# Patient Record
Sex: Female | Born: 1985 | Race: Black or African American | Hispanic: No | Marital: Married | Smoking: Never smoker
Health system: Southern US, Community
[De-identification: ages and names within clinical notes are randomized; demographics above are authoritative.]

## PROBLEM LIST (undated history)

## (undated) ENCOUNTER — Inpatient Hospital Stay (HOSPITAL_COMMUNITY): Payer: Self-pay

## (undated) DIAGNOSIS — R51 Headache: Secondary | ICD-10-CM

## (undated) DIAGNOSIS — Z789 Other specified health status: Secondary | ICD-10-CM

---

## 2007-06-01 ENCOUNTER — Encounter (INDEPENDENT_AMBULATORY_CARE_PROVIDER_SITE_OTHER): Payer: Self-pay | Admitting: Nurse Practitioner

## 2007-06-01 ENCOUNTER — Ambulatory Visit: Payer: Self-pay | Admitting: Internal Medicine

## 2007-06-01 DIAGNOSIS — M25539 Pain in unspecified wrist: Secondary | ICD-10-CM

## 2007-06-14 ENCOUNTER — Encounter (INDEPENDENT_AMBULATORY_CARE_PROVIDER_SITE_OTHER): Payer: Self-pay | Admitting: Internal Medicine

## 2007-06-30 ENCOUNTER — Encounter (INDEPENDENT_AMBULATORY_CARE_PROVIDER_SITE_OTHER): Payer: Self-pay | Admitting: Internal Medicine

## 2010-02-03 NOTE — Letter (Signed)
Summary: RECEVIED RECORDS FROM [facility]   RECEVIED RECORDS FROM [facility]   Imported By: Arta Bruce 08/21/2009 14:40:49  _____________________________________________________________________  External Attachment:    Type:   Image     Comment:   External Document

## 2010-04-11 ENCOUNTER — Emergency Department (HOSPITAL_COMMUNITY)
Admission: EM | Admit: 2010-04-11 | Discharge: 2010-04-11 | Disposition: A | Discharge status: Home / Self Care | Payer: Self-pay | Attending: Emergency Medicine | Admitting: Emergency Medicine

## 2010-04-11 DIAGNOSIS — R071 Chest pain on breathing: Secondary | ICD-10-CM | POA: Insufficient documentation

## 2010-04-11 DIAGNOSIS — M25539 Pain in unspecified wrist: Secondary | ICD-10-CM | POA: Insufficient documentation

## 2010-04-11 DIAGNOSIS — G5600 Carpal tunnel syndrome, unspecified upper limb: Secondary | ICD-10-CM | POA: Insufficient documentation

## 2010-04-11 DIAGNOSIS — R1013 Epigastric pain: Secondary | ICD-10-CM | POA: Insufficient documentation

## 2010-04-11 LAB — CBC
MCH: 30.2 pg (ref 26.0–34.0)
MCHC: 34.9 g/dL (ref 30.0–36.0)
Platelets: 252 10*3/uL (ref 150–400)

## 2010-04-11 LAB — GLUCOSE, CAPILLARY: Glucose-Capillary: 83 mg/dL (ref 70–99)

## 2010-04-11 LAB — URINALYSIS, ROUTINE W REFLEX MICROSCOPIC
Bilirubin Urine: NEGATIVE
Glucose, UA: NEGATIVE mg/dL
Hgb urine dipstick: NEGATIVE
Protein, ur: NEGATIVE mg/dL
Specific Gravity, Urine: 1.007 (ref 1.005–1.030)
Urobilinogen, UA: 0.2 mg/dL (ref 0.0–1.0)

## 2010-04-11 LAB — POCT I-STAT, CHEM 8
BUN: 8 mg/dL (ref 6–23)
HCT: 37 % (ref 36.0–46.0)
Hemoglobin: 12.6 g/dL (ref 12.0–15.0)
Sodium: 141 mEq/L (ref 135–145)
TCO2: 24 mmol/L (ref 0–100)

## 2010-04-11 LAB — DIFFERENTIAL
Basophils Relative: 0 % (ref 0–1)
Eosinophils Absolute: 0.1 10*3/uL (ref 0.0–0.7)
Eosinophils Relative: 2 % (ref 0–5)
Monocytes Absolute: 0.6 10*3/uL (ref 0.1–1.0)
Monocytes Relative: 8 % (ref 3–12)

## 2010-04-11 LAB — POCT CARDIAC MARKERS: Myoglobin, poc: 35.6 ng/mL (ref 12–200)

## 2010-05-04 ENCOUNTER — Emergency Department (HOSPITAL_COMMUNITY)
Admission: EM | Admit: 2010-05-04 | Discharge: 2010-05-04 | Disposition: A | Discharge status: Home / Self Care | Payer: Self-pay | Attending: Emergency Medicine | Admitting: Emergency Medicine

## 2010-05-04 ENCOUNTER — Emergency Department (HOSPITAL_COMMUNITY): Payer: Self-pay

## 2010-05-04 DIAGNOSIS — K219 Gastro-esophageal reflux disease without esophagitis: Secondary | ICD-10-CM | POA: Insufficient documentation

## 2010-05-04 DIAGNOSIS — R079 Chest pain, unspecified: Secondary | ICD-10-CM | POA: Insufficient documentation

## 2010-05-04 DIAGNOSIS — R1013 Epigastric pain: Secondary | ICD-10-CM | POA: Insufficient documentation

## 2010-10-18 ENCOUNTER — Observation Stay (HOSPITAL_COMMUNITY)
Admission: AD | Admit: 2010-10-18 | Discharge: 2010-10-19 | Disposition: A | Discharge status: Home / Self Care | Payer: Medicaid Other | Source: Ambulatory Visit | Attending: Obstetrics & Gynecology | Admitting: Obstetrics & Gynecology

## 2010-10-18 ENCOUNTER — Emergency Department (HOSPITAL_COMMUNITY): Payer: Medicaid Other

## 2010-10-18 ENCOUNTER — Emergency Department (HOSPITAL_COMMUNITY)
Admission: EM | Admit: 2010-10-18 | Discharge: 2010-10-18 | Disposition: A | Discharge status: Nursing Home (Includes ICF) | Payer: Medicaid Other | Attending: Emergency Medicine | Admitting: Emergency Medicine

## 2010-10-18 ENCOUNTER — Encounter (HOSPITAL_COMMUNITY): Payer: Self-pay

## 2010-10-18 DIAGNOSIS — R42 Dizziness and giddiness: Secondary | ICD-10-CM | POA: Insufficient documentation

## 2010-10-18 DIAGNOSIS — R110 Nausea: Secondary | ICD-10-CM | POA: Insufficient documentation

## 2010-10-18 DIAGNOSIS — Z30432 Encounter for removal of intrauterine contraceptive device: Secondary | ICD-10-CM | POA: Insufficient documentation

## 2010-10-18 DIAGNOSIS — O200 Threatened abortion: Principal | ICD-10-CM | POA: Diagnosis present

## 2010-10-18 DIAGNOSIS — O209 Hemorrhage in early pregnancy, unspecified: Secondary | ICD-10-CM | POA: Insufficient documentation

## 2010-10-18 DIAGNOSIS — Z30431 Encounter for routine checking of intrauterine contraceptive device: Secondary | ICD-10-CM

## 2010-10-18 DIAGNOSIS — IMO0002 Reserved for concepts with insufficient information to code with codable children: Secondary | ICD-10-CM | POA: Insufficient documentation

## 2010-10-18 DIAGNOSIS — R109 Unspecified abdominal pain: Secondary | ICD-10-CM | POA: Insufficient documentation

## 2010-10-18 DIAGNOSIS — R630 Anorexia: Secondary | ICD-10-CM | POA: Insufficient documentation

## 2010-10-18 LAB — POCT I-STAT, CHEM 8
BUN: 6 mg/dL (ref 6–23)
Calcium, Ion: 1.19 mmol/L (ref 1.12–1.32)
Chloride: 104 mEq/L (ref 96–112)
Creatinine, Ser: 0.6 mg/dL (ref 0.50–1.10)
Glucose, Bld: 76 mg/dL (ref 70–99)
HCT: 32 % — ABNORMAL LOW (ref 36.0–46.0)
Hemoglobin: 10.9 g/dL — ABNORMAL LOW (ref 12.0–15.0)
Potassium: 3.7 mEq/L (ref 3.5–5.1)
Sodium: 137 mEq/L (ref 135–145)
TCO2: 22 mmol/L (ref 0–100)

## 2010-10-18 LAB — ABO/RH
ABO/RH(D): O POS
ABO/RH(D): O POS

## 2010-10-18 LAB — URINALYSIS, ROUTINE W REFLEX MICROSCOPIC
Bilirubin Urine: NEGATIVE
Nitrite: NEGATIVE
Protein, ur: NEGATIVE mg/dL
Specific Gravity, Urine: 1.022 (ref 1.005–1.030)
Urobilinogen, UA: 1 mg/dL (ref 0.0–1.0)

## 2010-10-18 LAB — CBC
MCHC: 35.5 g/dL (ref 30.0–36.0)
MCV: 85.9 fL (ref 78.0–100.0)
Platelets: 226 10*3/uL (ref 150–400)
RDW: 13 % (ref 11.5–15.5)
WBC: 9.2 10*3/uL (ref 4.0–10.5)

## 2010-10-18 LAB — TYPE AND SCREEN: Antibody Screen: NEGATIVE

## 2010-10-18 LAB — WET PREP, GENITAL
Clue Cells Wet Prep HPF POC: NONE SEEN
Trich, Wet Prep: NONE SEEN
Yeast Wet Prep HPF POC: NONE SEEN

## 2010-10-18 LAB — URINE MICROSCOPIC-ADD ON

## 2010-10-18 LAB — POCT PREGNANCY, URINE: Preg Test, Ur: POSITIVE

## 2010-10-18 LAB — HCG, QUANTITATIVE, PREGNANCY: hCG, Beta Chain, Quant, S: 110442 m[IU]/mL — ABNORMAL HIGH (ref <5)

## 2010-10-18 MED ORDER — CLINDAMYCIN PHOSPHATE 900 MG/50ML IV SOLN
900.0000 mg | Freq: Three times a day (TID) | INTRAVENOUS | Status: DC
  Administered 2010-10-18: 900 mg via INTRAVENOUS
  Filled 2010-10-18 (×3): qty 50

## 2010-10-18 MED ORDER — ZOLPIDEM TARTRATE 5 MG PO TABS: 5.0000 mg | ORAL_TABLET | Freq: Every evening | ORAL | Status: DC | PRN

## 2010-10-18 MED ORDER — SODIUM CHLORIDE 0.9 % IV SOLN: 250.0000 mL | INTRAVENOUS | Status: DC

## 2010-10-18 MED ORDER — DOCUSATE SODIUM 100 MG PO CAPS
100.0000 mg | ORAL_CAPSULE | Freq: Every day | ORAL | Status: DC
  Administered 2010-10-19: 100 mg via ORAL
  Filled 2010-10-18: qty 1

## 2010-10-18 MED ORDER — SIMETHICONE 80 MG PO CHEW: 80.0000 mg | CHEWABLE_TABLET | Freq: Four times a day (QID) | ORAL | Status: DC | PRN

## 2010-10-18 MED ORDER — PRENATAL PLUS 27-1 MG PO TABS
1.0000 | ORAL_TABLET | Freq: Every day | ORAL | Status: DC
  Administered 2010-10-19: 1 via ORAL
  Filled 2010-10-18: qty 1

## 2010-10-18 MED ORDER — ONDANSETRON HCL 4 MG/2ML IJ SOLN: 4.0000 mg | Freq: Four times a day (QID) | INTRAMUSCULAR | Status: DC | PRN

## 2010-10-18 MED ORDER — SODIUM CHLORIDE 0.9 % IJ SOLN: 3.0000 mL | Freq: Two times a day (BID) | INTRAMUSCULAR | Status: DC

## 2010-10-18 MED ORDER — ONDANSETRON HCL 4 MG PO TABS: 4.0000 mg | ORAL_TABLET | Freq: Four times a day (QID) | ORAL | Status: DC | PRN

## 2010-10-18 MED ORDER — MAGNESIUM HYDROXIDE 400 MG/5ML PO SUSP: 30.0000 mL | Freq: Every day | ORAL | Status: DC | PRN

## 2010-10-18 MED ORDER — SODIUM CHLORIDE 0.9 % IJ SOLN: 3.0000 mL | INTRAMUSCULAR | Status: DC | PRN

## 2010-10-18 MED ORDER — OXYCODONE-ACETAMINOPHEN 5-325 MG PO TABS
1.0000 | ORAL_TABLET | ORAL | Status: DC | PRN
  Administered 2010-10-18: 1 via ORAL
  Filled 2010-10-18: qty 1

## 2010-10-18 MED ORDER — CLINDAMYCIN PHOSPHATE 900 MG/50ML IV SOLN
900.0000 mg | Freq: Three times a day (TID) | INTRAVENOUS | Status: DC
  Administered 2010-10-19: 900 mg via INTRAVENOUS
  Filled 2010-10-18 (×3): qty 50

## 2010-10-18 NOTE — H&P (Signed)
Ann Clarke is an 25 y.o. female c/o vaginal bleeding  HPI:  Several month h/o abnormal bleeding.  She presented to the [facility] ED and was diagnosed w/a 14 week, viable IUP.  An U/S showed a subchorionic hemorrhage and IUD in the cervix.  Pertinent Gynecological History: Contraception: IUD   Menstrual History:  Patient's last menstrual period was 06/18/2010.    History reviewed. No pertinent past medical history.  Past Surgical History  Procedure Date  . Cesarean section     History reviewed. No pertinent family history.  Social History:  reports that she has never smoked. She has never used smokeless tobacco. She reports that she does not drink alcohol or use illicit drugs.  Allergies: No Known Allergies  No prescriptions prior to admission    Review of Systems  Constitutional: Negative for fever.  Eyes: Negative for blurred vision.  Respiratory: Negative for shortness of breath.   Gastrointestinal: Negative for nausea and vomiting.  Genitourinary:       Vaginal bleeding  Skin: Negative for rash.  Neurological: Negative for headaches.    Blood pressure 90/60, pulse 71, temperature 98.9 F (37.2 C), temperature source Oral, resp. rate 16, height 4\' 11"  (1.499 m), weight 68.04 kg (150 lb), last menstrual period 06/18/2010, SpO2 100.00%. Physical Exam  Constitutional: She appears well-developed.  HENT:  Head: Normocephalic.  Neck: Neck supple. No thyromegaly present.  Cardiovascular: Normal rate and regular rhythm.   Respiratory: Breath sounds normal.  GI: Soft. Bowel sounds are normal.  Genitourinary:       Moderate heme per NP  Skin: No rash noted.    Results for orders placed during the hospital encounter of 10/18/10 (from the past 24 hour(s))  TYPE AND SCREEN     Status: Normal   Collection Time   10/18/10  5:00 PM      Component Value Range   ABO/RH(D) O POS     Antibody Screen NEG     Sample Expiration 10/21/2010    ABO/RH     Status: Normal   Collection Time   10/18/10  5:00 PM      Component Value Range   ABO/RH(D) O POS      US Ob Limited  10/18/2010  **ADDENDUM** CREATED: 10/18/2010 11:17:41  Although not reported as part of the initial history, this patient apparently has an IUD in situ.  Reevaluation of the ultrasound images reveals a linear echogenic structure within the proximal cervix.  This would be compatible with an IUD.  **END ADDENDUM** SIGNED BY: Minerva Areola A. Molli Posey, M.D.   10/18/2010  *RADIOLOGY REPORT*  Clinical Data: Positive pregnancy test with the vaginal bleeding.  LIMITED OBSTETRIC ULTRASOUND single living intrauterine gestation is identified.  The BPD is 2.5 cm which estimates a 14-week-1-day gestational age.  Fetal heart activity is visible within the measured rate of 157 beats per minute.  Fetal movement is observed. Amniotic fluid volume is subjectively normal.  Posterior placenta noted with inferior margin near the internal cervical os.  The patient has a small to moderate posterior subchorionic hemorrhage with a second apparent tiny focus of subchorionic hemorrhage anteriorly. Maternal ovaries are unremarkable.  IMPRESSION: Single living intrauterine gestation at an estimated 14-week-1-day distention only age by ultrasound.  Two foci of subchorionic hemorrhage are evident, the larger is small to moderate in character posteriorly.  Dedicated fetal anatomic assessment at 18 weeks is recommended for further evaluation. Original Report Authenticated By: ERIC A. MANSELL, M.D.    Assessment/Plan:24 y.o. w/an IUP @  14 weeks.  Threatened AB.  IUD insitu.  RH pos  Observation Strict pad count Attempt IUD removal  JACKSON-MOORE,Abas Leicht A 10/18/2010, 9:17 PM

## 2010-10-19 DIAGNOSIS — Z30431 Encounter for routine checking of intrauterine contraceptive device: Secondary | ICD-10-CM

## 2010-10-19 LAB — CBC
HCT: 29.2 % — ABNORMAL LOW (ref 36.0–46.0)
Hemoglobin: 10.2 g/dL — ABNORMAL LOW (ref 12.0–15.0)
RBC: 3.33 MIL/uL — ABNORMAL LOW (ref 3.87–5.11)

## 2010-10-19 LAB — GC/CHLAMYDIA PROBE AMP, GENITAL
Chlamydia, DNA Probe: NEGATIVE
GC Probe Amp, Genital: NEGATIVE

## 2010-10-19 MED ORDER — CLINDAMYCIN HCL 300 MG PO CAPS: 300.0000 mg | ORAL_CAPSULE | Freq: Three times a day (TID) | ORAL | Status: DC

## 2010-10-19 NOTE — Progress Notes (Signed)
  S: Less bleeding  O: Blood pressure 94/56, pulse 73, temperature 98.8 F (37.1 C), temperature source Oral, resp. rate 16, height 4\' 11"  (1.499 m), weight 68.04 kg (150 lb), last menstrual period 06/18/2010, SpO2 99.00%.   FHT:FHR by doppler OK Toco: N/A SVE:   A/P- 25 y.o. admitted with threatened abortion/IUD insitu  Bleeding decreased  Attempt IUD removal D/C planning

## 2010-10-19 NOTE — Discharge Summary (Signed)
  Physician Discharge Summary  Patient ID: Ann Clarke MRN: [id number] DOB/AGE: 1985-11-08 24 y.o.  Admit date: 10/18/2010 Discharge date: 10/19/2010  Admission Diagnoses:    Threatened abortion    Discharge Diagnoses:  Active Problems:  Threatened abortion  Surveillance of previously prescribed intrauterine contraceptive device   Discharged Condition: stable  Hospital Course:   The patient presented to the [facility] ED with vaginal bleeding.  She was found to have a viable 14 week IUP with an IUD insitu.  An U/S also showed a subchorionic hemorrhage.  The patient's blood type is RH positive.  The bleeding abated.  The IUD was removed.  Consults: none  Significant Diagnostic Studies: radiology: Ultrasound: See above  Treatments: antibiotics: Clindamycin  Discharge Exam: Blood pressure 94/56, pulse 73, temperature 98.8 F (37.1 C), temperature source Oral, resp. rate 16, height 4\' 11"  (1.499 m), weight 68.04 kg (150 lb), last menstrual period 06/18/2010, SpO2 99.00%. General appearance: alert Pelvic: cervix closed, small heme  Disposition: Home      Signed: JACKSON-MOORE,Raseel Jans A 10/19/2010, 9:35 AM

## 2010-10-19 NOTE — Progress Notes (Signed)
Pt discharged to home with husband.  Condition stable.  Pt to car via wheelchair with NT.   No equipment for home ordered at discharge.

## 2010-10-19 NOTE — Progress Notes (Signed)
The patient was placed in lithotomy.  A speculum was placed.  The cervix appeared closed.  The IUD strings were seen.  There was small heme in the vaginal vault.  The IUD strings were grasped.  The IUD was removed intact and sent for C&S.

## 2010-10-19 NOTE — Progress Notes (Signed)
UR chart review completed.  

## 2010-10-24 ENCOUNTER — Encounter (HOSPITAL_COMMUNITY): Payer: Self-pay

## 2010-10-24 ENCOUNTER — Inpatient Hospital Stay (HOSPITAL_COMMUNITY)
Admission: AD | Admit: 2010-10-24 | Discharge: 2010-10-24 | Disposition: A | Discharge status: Home / Self Care | Payer: Self-pay | Source: Ambulatory Visit | Attending: Obstetrics & Gynecology | Admitting: Obstetrics & Gynecology

## 2010-10-24 DIAGNOSIS — O209 Hemorrhage in early pregnancy, unspecified: Secondary | ICD-10-CM

## 2010-10-24 NOTE — Progress Notes (Signed)
Patient c/o lower abdominal pain since last night, was seen in MAU last Monday for bleeding, still bleeding like a period.

## 2010-10-24 NOTE — ED Provider Notes (Signed)
Ceriah Clarke y.o.G3P2002 @15 .0 wk BEGA by [place] at [redacted]w[redacted]d Chief Complaint  Patient presents with  . Abdominal Pain  . Vaginal Bleeding    SUBJECTIVE  HPI: for the last day she is been bleeding a little more heavily and cramping intermittently. She initially presented to hospital 10/18/2010 4 vaginal bleeding with IUD in place. She was transferred here and kept overnight for observation after the IUD was removed by Dr. Tamela Oddi. Her ultrasound showed a viable IUP at 14 weeks.  No past medical history on file. Ob Hx: Gyn Hx: Past Surgical History  Procedure Date  . Cesarean section    History   Social History  . Marital Status: Married    Spouse Name: N/A    Number of Children: N/A  . Years of Education: N/A   Occupational History  . Not on file.   Social History Main Topics  . Smoking status: Never Smoker   . Smokeless tobacco: Never Used  . Alcohol Use: No  . Drug Use: No  . Sexually Active: Yes    Birth Control/ Protection: IUD   Other Topics Concern  . Not on file   Social History Narrative  . No narrative on file   No current facility-administered medications on file prior to encounter.   No current outpatient prescriptions on file prior to encounter.   No Known Allergies  ROS: Pertinent items in HPI  OBJECTIVE  BP 118/59  Pulse 77  Temp(Src) 99.4 F (37.4 C) (Oral)  Resp 16  Ht 4' 11.5" (1.511 m)  Wt 67.949 kg (149 lb 12.8 oz)  BMI 29.75 kg/m2  LMP 06/18/2010   Physical Exam:  DT FHR 160. BS [place] by me to reassure parents: saw cardiac activity and FM  General: WN/WD in NAD Abd: soft NT uterus 14-16 wk Pelvic: scant dk red heme. Cx L/C/H Back:neg CVAT  ASSESSMENT  Multip at 15 wks viable IUP with vaginal bleeding post IUD removal.    PLAN Reassured S/SX SAB reviewed Keep appt in 4 d at [facility]

## 2010-10-25 ENCOUNTER — Inpatient Hospital Stay (HOSPITAL_COMMUNITY)
Admission: AD | Admit: 2010-10-25 | Discharge: 2010-10-25 | Disposition: A | Discharge status: Home / Self Care | Payer: Medicaid Other | Source: Ambulatory Visit | Attending: Obstetrics and Gynecology | Admitting: Obstetrics and Gynecology

## 2010-10-25 ENCOUNTER — Other Ambulatory Visit: Payer: Self-pay | Admitting: Obstetrics and Gynecology

## 2010-10-25 ENCOUNTER — Encounter (HOSPITAL_COMMUNITY): Payer: Self-pay | Admitting: Advanced Practice Midwife

## 2010-10-25 DIAGNOSIS — O039 Complete or unspecified spontaneous abortion without complication: Secondary | ICD-10-CM | POA: Insufficient documentation

## 2010-10-25 DIAGNOSIS — O209 Hemorrhage in early pregnancy, unspecified: Secondary | ICD-10-CM | POA: Insufficient documentation

## 2010-10-25 HISTORY — DX: Other specified health status: Z78.9

## 2010-10-25 LAB — CBC
Hemoglobin: 10.3 g/dL — ABNORMAL LOW (ref 12.0–15.0)
Platelets: 241 10*3/uL (ref 150–400)
RBC: 3.37 MIL/uL — ABNORMAL LOW (ref 3.87–5.11)
WBC: 14.6 10*3/uL — ABNORMAL HIGH (ref 4.0–10.5)

## 2010-10-25 MED ORDER — OXYCODONE-ACETAMINOPHEN 5-325 MG PO TABS
1.0000 | ORAL_TABLET | Freq: Once | ORAL | Status: AC
  Administered 2010-10-25: 1 via ORAL
  Filled 2010-10-25: qty 1

## 2010-10-25 MED ORDER — IBUPROFEN 800 MG PO TABS
800.0000 mg | ORAL_TABLET | Freq: Once | ORAL | Status: AC
  Administered 2010-10-25: 800 mg via ORAL
  Filled 2010-10-25: qty 1

## 2010-10-25 MED ORDER — OXYCODONE-ACETAMINOPHEN 5-325 MG PO TABS: 1.0000 | ORAL_TABLET | ORAL | Status: AC | PRN

## 2010-10-25 NOTE — Progress Notes (Signed)
Pt here yesterday for vaginal bleeding and abd pain. Reports bleeding is heavier today and pain is worse. Unable to get rx filled. Stated pharmacy said tylenol has been recalled and there was none.

## 2010-10-25 NOTE — Progress Notes (Signed)
Pt's husband at bedside.  Expressing some concern as to pt delivering at home after being seen here earlier.  Reassured husband and pt and will get provider to see pt and husband per husband's request.

## 2010-10-25 NOTE — Progress Notes (Signed)
Pt presents to mau via ems for miscarriage at home.  Brought fetus with her.  Placenta had not delivered at that time.

## 2010-10-25 NOTE — Progress Notes (Signed)
Discussed with pt about disposal of baby.  Given option to take home or hospital disposal.  Pt wishes for hospital disposal.

## 2010-10-25 NOTE — Progress Notes (Signed)
N. Bascom Levels, CNM at bedside.  poc discussed with pt and family.

## 2010-10-25 NOTE — Progress Notes (Signed)
Placenta removed manually via Telford Nab, CNM

## 2010-10-25 NOTE — ED Provider Notes (Signed)
History     Chief Complaint  Patient presents with  . Miscarriage   HPI Onset of pain and bleeding yesterday, seen in MAU, cervix LTC yesterday, delivered 15 week fetus at home, placenta in vagina upon arrival to MAU.  OB History    Grav Para Term Preterm Abortions TAB SAB Ect Mult Living   3 2 2       2       No past medical history on file.  Past Surgical History  Procedure Date  . Cesarean section     No family history on file.  History  Substance Use Topics  . Smoking status: Never Smoker   . Smokeless tobacco: Never Used  . Alcohol Use: No    Allergies: No Known Allergies  Prescriptions prior to admission  Medication Sig Dispense Refill  . clindamycin (CLEOCIN) 300 MG capsule Take 300 mg by mouth 3 (three) times daily.        Marland Kitchen oxyCODONE-acetaminophen (PERCOCET) 5-325 MG per tablet Take 1 tablet by mouth every 4 (four) hours as needed for pain.  6 tablet  0    Review of Systems  Respiratory: Negative.   Cardiovascular: Negative.   Gastrointestinal: Positive for abdominal pain.  Genitourinary:       + vaginal bleeding   Neurological: Negative.    Physical Exam   Blood pressure 133/84, pulse 82, temperature 98.6 F (37 C), resp. rate 20, height 4\' 11"  (1.499 m), weight 68.04 kg (150 lb), last menstrual period 06/18/2010.  Physical Exam  Constitutional: She appears well-developed and well-nourished. No distress.  Cardiovascular: Normal rate.   Respiratory: Effort normal.  GI: Soft. There is tenderness (low abd).  Genitourinary: There is bleeding around the vagina.       Partial placenta at introitus upon arrival to mau, upon speculum exam, remainder of placenta noted in cervical os, removed with ring forceps, large gush of blood and clots immediately evacuated, bleeding then slowed, fundus firm    MAU Course  Procedures  No results found for this or any previous visit (from the past 24 hour(s)).  Percocet and Motrin ordered for pain.   Assessment  and Plan  CBC pending Care assumed by S. Breeanna Galgano, CNM  [facility] 10/25/2010, 7:24 PM   Complete AB at 15 weeks D/C home Continue pain meds and ABX as prescribed FU in Gyn Clinic in 4-6 weeks

## 2010-10-25 NOTE — ED Provider Notes (Signed)
Ann Clarke y.o.G3P2002 @[redacted]w[redacted]d  BEGA by 14 wk [place] Chief Complaint  Patient presents with  . Vaginal Bleeding  . Abdominal Pain    SUBJECTIVE  HPI: Presents via EMS stating her pain is worse than yesterday and her bleeding is "not like it should be." She passed a black clot about the size of a fingernail. Her partner states they were not able to find Tylenol at the drug store. At one point she told him she couldn't walk due to the pain. I saw her here yesterday for vaginal bleeding and abdominal pain and confirmed IUP and by Doptone fetal heart rate as well as bedside ultrasound so the couple could see fetal movement and fetal cardiac activity. She initially presented to [facility] ED on 10/18/2010 with history of abnormal leading for several months. She was found to have a viable IUP, IUD in situ and subchorionic hemorrhage. IUD was removed by Dr. Tamela Oddi and patient was observed overnight and sent home on clindamycin. The bleeding has been continuous since then and on exam yesterday she had small amount of vaginal bleeding.  No past medical history on file.  Past Surgical History  Procedure Date  . Cesarean section    History   Social History  . Marital Status: Married    Spouse Name: N/A    Number of Children: N/A  . Years of Education: N/A   Occupational History  . Not on file.   Social History Main Topics  . Smoking status: Never Smoker   . Smokeless tobacco: Never Used  . Alcohol Use: No  . Drug Use: No  . Sexually Active: Yes    Birth Control/ Protection: IUD   Other Topics Concern  . Not on file   Social History Narrative  . No narrative on file   No current facility-administered medications on file prior to encounter.   Current Outpatient Prescriptions on File Prior to Encounter  Medication Sig Dispense Refill  . clindamycin (CLEOCIN) 300 MG capsule Take 300 mg by mouth 3 (three) times daily.         No Known Allergies  ROS: Pertinent items in  HPI  OBJECTIVE  BP 120/81  Pulse 98  Temp(Src) 99.1 F (37.3 C) (Oral)  Resp 18  LMP 06/18/2010   Physical Exam:  General: WN/WD in NAD Abd: soft NT. Fundus at u (short torso) DT FHR 180's with audible FM.  Pelvic: deferred. Peri pad had 3 cm streak of dark dry blood    ASSESSMENT G3 P2 002 at 15 weeks 1 day with viable IUP Status post IUD removal at 14 weeks Bleeding in early pregnancy    PLAN  Reassured of viability. Advised to keep her appointment at [facility] later this week. Again reviewed signs and symptoms of SAB. Will give her 6 Percocets to take 1 every 4-6 hours when necessary for breakthrough pain if Tylenol ineffective.

## 2010-10-25 NOTE — Progress Notes (Signed)
Ann Clarke, CNM at bedside.  poc discussed with pt.

## 2010-10-26 NOTE — ED Provider Notes (Signed)
Agree with above note.  Aladdin Kollmann 10/26/2010 7:33 AM

## 2010-10-26 NOTE — ED Provider Notes (Signed)
Agree with above note.  Ann Clarke 10/26/2010 7:41 AM

## 2010-11-05 DEATH — deceased

## 2010-12-14 ENCOUNTER — Encounter: Payer: Self-pay | Admitting: Physician Assistant

## 2011-01-04 ENCOUNTER — Encounter (HOSPITAL_COMMUNITY): Payer: Self-pay

## 2011-01-04 ENCOUNTER — Emergency Department (HOSPITAL_COMMUNITY): Payer: Self-pay

## 2011-01-04 ENCOUNTER — Emergency Department (HOSPITAL_COMMUNITY)
Admission: EM | Admit: 2011-01-04 | Discharge: 2011-01-04 | Disposition: A | Discharge status: Home / Self Care | Payer: Self-pay | Attending: Emergency Medicine | Admitting: Emergency Medicine

## 2011-01-04 DIAGNOSIS — R109 Unspecified abdominal pain: Secondary | ICD-10-CM | POA: Insufficient documentation

## 2011-01-04 DIAGNOSIS — R002 Palpitations: Secondary | ICD-10-CM | POA: Insufficient documentation

## 2011-01-04 DIAGNOSIS — F411 Generalized anxiety disorder: Secondary | ICD-10-CM | POA: Insufficient documentation

## 2011-01-04 DIAGNOSIS — R51 Headache: Secondary | ICD-10-CM | POA: Insufficient documentation

## 2011-01-04 LAB — BASIC METABOLIC PANEL
BUN: 11 mg/dL (ref 6–23)
CO2: 25 mEq/L (ref 19–32)
Chloride: 103 mEq/L (ref 96–112)
Glucose, Bld: 81 mg/dL (ref 70–99)
Potassium: 3.5 mEq/L (ref 3.5–5.1)
Sodium: 137 mEq/L (ref 135–145)

## 2011-01-04 LAB — POCT I-STAT, CHEM 8
Chloride: 105 mEq/L (ref 96–112)
Creatinine, Ser: 0.7 mg/dL (ref 0.50–1.10)
Glucose, Bld: 82 mg/dL (ref 70–99)
Potassium: 3.6 mEq/L (ref 3.5–5.1)
Sodium: 141 mEq/L (ref 135–145)

## 2011-01-04 LAB — DIFFERENTIAL
Basophils Absolute: 0 10*3/uL (ref 0.0–0.1)
Basophils Relative: 0 % (ref 0–1)
Eosinophils Absolute: 0.1 10*3/uL (ref 0.0–0.7)
Eosinophils Relative: 2 % (ref 0–5)
Lymphocytes Relative: 39 % (ref 12–46)
Monocytes Absolute: 0.5 10*3/uL (ref 0.1–1.0)

## 2011-01-04 LAB — URINALYSIS, ROUTINE W REFLEX MICROSCOPIC
Glucose, UA: NEGATIVE mg/dL
Ketones, ur: NEGATIVE mg/dL
Leukocytes, UA: NEGATIVE
Nitrite: NEGATIVE
Specific Gravity, Urine: 1.016 (ref 1.005–1.030)
pH: 6 (ref 5.0–8.0)

## 2011-01-04 LAB — WET PREP, GENITAL: Yeast Wet Prep HPF POC: NONE SEEN

## 2011-01-04 LAB — CBC
MCHC: 32.2 g/dL (ref 30.0–36.0)
MCV: 82.6 fL (ref 78.0–100.0)
Platelets: 258 10*3/uL (ref 150–400)
RDW: 14.2 % (ref 11.5–15.5)
WBC: 5.2 10*3/uL (ref 4.0–10.5)

## 2011-01-04 MED ORDER — IBUPROFEN 800 MG PO TABS
800.0000 mg | ORAL_TABLET | Freq: Once | ORAL | Status: AC
  Administered 2011-01-04: 800 mg via ORAL
  Filled 2011-01-04: qty 1

## 2011-01-04 MED ORDER — IBUPROFEN 800 MG PO TABS: 800.0000 mg | ORAL_TABLET | Freq: Three times a day (TID) | ORAL | Status: AC | PRN

## 2011-01-04 NOTE — ED Notes (Signed)
Returned from ultrasound.

## 2011-01-04 NOTE — ED Notes (Signed)
Family at bedside. 

## 2011-01-04 NOTE — ED Notes (Signed)
Pt states that since she had a miscarriage in October of this year she had continued to have headaches, abdominal pain, and just generalized malaise. Pt is alert and oriented. Family at the bedside to translate. See triage note for further details of follow up regarding miscarriage. Pt denies any vaginal irritation or discharge. Last normal period was 12-09-10.

## 2011-01-04 NOTE — ED Notes (Signed)
POCT urine preg resulted NEGATIVE

## 2011-01-04 NOTE — ED Notes (Signed)
Pt had a miscarriage in October of this past year. She states that since then she has been having abdominal pain, heart racing, and a headache. She states that she has been having generalized malaise. She is alert and oriented. She was admitted in October at that time for the miscarriage. She was supposed to follow up with unassigned ob but the family states that "someone in the office did not call to set up appointment" so she has not had any follow up with ob. She went to the health department on nov 15 for an exam and states that everything was ok at that point in time. Pt has continued to have headaches, generalized malaise, abdominal pain, and she gets anxious and feels like her heart is racing at times. Family at the bedside to translate for pt.

## 2011-01-04 NOTE — ED Notes (Signed)
Patient is resting comfortably. 

## 2011-01-04 NOTE — ED Provider Notes (Signed)
History     CSN: [id number]  Arrival date & time 01/04/11  1191   First MD Initiated Contact with Patient 01/04/11 1003      Chief Complaint  Patient presents with  . 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  Spouse reports patient (G3P2) had a miscarriage on Oct 21 at [redacted] weeks gestation.  Pt did not know she was pregnant, did not follow up with obgyn after miscarriage.  Since then, patient has had lower abdominal pain, intermittent headaches, anxiety and palpitations.  States the abdominal pain is worse at night, is described as feeling like gas and crampy.  Denies fevers, CP, SOB, N/V, change in bowel habits, urinary symptoms, abnormal vaginal discharge or bleeding.  LMP Dec 5.  Pt is eating and drinking normally.  Took tylenol for headache but it caused itching.   Per medical record and patient's husband, patient had IUD in place when she became pregnant, had IUD removed around Oct 14, miscarried approximately one week later.  Pt was unable to follow up.  There are no records of [place] following miscarriage.  [facility] patient delivered fetus at home, placenta in vagina on arrival.    Patient is a 25 y.o. female presenting with abdominal pain. The history is provided by the patient, the spouse and medical records. The history is limited by a language barrier.  Abdominal Pain The primary symptoms of the illness include abdominal pain.    Past Medical History  Diagnosis Date  . No pertinent past medical history     Past Surgical History  Procedure Date  . Cesarean section     History reviewed. No pertinent family history.  History  Substance Use Topics  . Smoking status: Never Smoker   . Smokeless tobacco: Never Used  . Alcohol Use: No    OB History    Grav Para Term Preterm Abortions TAB  SAB Ect Mult Living   3 2 2       2       Review of Systems  Gastrointestinal: Positive for abdominal pain.  All other systems reviewed and are negative.    Allergies  Tylenol  Home Medications  No current outpatient prescriptions on file.  BP 125/74  Pulse 77  Temp(Src) 98 F (36.7 C) (Oral)  Resp 18  SpO2 100%  LMP 12/09/2010  Breastfeeding? Unknown  Physical Exam  Nursing note and vitals reviewed. Constitutional: She is oriented to person, place, and time. She appears well-developed and well-nourished.  HENT:  Head: Normocephalic and atraumatic.  Neck: Neck supple.  Cardiovascular: Normal rate, regular rhythm and normal heart sounds.   Pulmonary/Chest: Breath sounds normal. No respiratory distress. She has no wheezes. She has no rales. She exhibits no tenderness.  Abdominal: Soft. Bowel sounds are normal. She exhibits no distension and no mass. There is no tenderness. There is no rebound, no guarding and no CVA tenderness.  Genitourinary: Vagina normal. Uterus is not tender. Cervix exhibits no motion tenderness, no discharge and no friability. Right adnexum displays no mass, no tenderness and no fullness. Left adnexum displays no mass, no tenderness and no fullness. No vaginal discharge found.  Neurological: She is alert and oriented to person, place, and time.    ED Course  Procedures (including critical care time)  Labs Reviewed  CBC - Abnormal; Notable for the following:    Hemoglobin 11.3 (*)  HCT 35.1 (*)    All other components within normal limits  WET PREP, GENITAL - Abnormal; Notable for the following:    WBC, Wet Prep HPF POC FEW (*)    All other components within normal limits  URINALYSIS, ROUTINE W REFLEX MICROSCOPIC  DIFFERENTIAL  BASIC METABOLIC PANEL  POCT PREGNANCY, URINE  POCT I-STAT, CHEM 8  POCT PREGNANCY, URINE  I-STAT, CHEM 8  GC/CHLAMYDIA PROBE AMP, GENITAL   US Transvaginal Non-ob  01/04/2011  *RADIOLOGY REPORT*  Clinical Data:  Persistent lower abdominal pain after spontaneous abortion of a 14-week gestational age fetus October 2012.  TRANSABDOMINAL AND TRANSVAGINAL ULTRASOUND OF PELVIS Technique:  Both transabdominal and transvaginal ultrasound examinations of the pelvis were performed. Transabdominal technique was performed for global imaging of the pelvis including uterus, ovaries, adnexal regions, and pelvic cul-de-sac.  Comparison: 10/18/2010   It was necessary to proceed with endovaginal exam following the transabdominal exam to visualize the endometrium and ovaries.  Findings:  Uterus: 12.3 x 6.0 x 5.5 cm.  Anteverted, anteflexed.  No focal abnormality.  Endometrium: 1.3 cm, uniformly echogenic.  No focal abnormality or internal color flow.  Right ovary:  3.8 x 2.4 x 2.1 cm.  Normal.  Left ovary: 3.3 x 2.7 x 2.1 cm.  Normal.  Other findings: No free fluid  IMPRESSION: No sonographic evidence for retained products of conception. Normal exam.  Original Report Authenticated By: Harrel Lemon, M.D.   US Pelvis Complete  01/04/2011  *RADIOLOGY REPORT*  Clinical Data: Persistent lower abdominal pain after spontaneous abortion of a 14-week gestational age fetus October 2012.  TRANSABDOMINAL AND TRANSVAGINAL ULTRASOUND OF PELVIS Technique:  Both transabdominal and transvaginal ultrasound examinations of the pelvis were performed. Transabdominal technique was performed for global imaging of the pelvis including uterus, ovaries, adnexal regions, and pelvic cul-de-sac.  Comparison: 10/18/2010   It was necessary to proceed with endovaginal exam following the transabdominal exam to visualize the endometrium and ovaries.  Findings:  Uterus: 12.3 x 6.0 x 5.5 cm.  Anteverted, anteflexed.  No focal abnormality.  Endometrium: 1.3 cm, uniformly echogenic.  No focal abnormality or internal color flow.  Right ovary:  3.8 x 2.4 x 2.1 cm.  Normal.  Left ovary: 3.3 x 2.7 x 2.1 cm.  Normal.  Other findings: No free fluid  IMPRESSION: No sonographic  evidence for retained products of conception. Normal exam.  Original Report Authenticated By: Harrel Lemon, M.D.   Anemia is chronic.    1. Abdominal pain       MDM  Patient with lower abdominal pain since spontaneous abortion 3 months ago and intermittent headaches  - workup is unremarkable, physical exam is normal.  US shows no retained POC.  I have given patient ibuprofen and resources for follow up.         Rise Patience, [place] 01/04/11 1357

## 2011-01-04 NOTE — ED Notes (Signed)
Assisted and chaperoned Ann Clarke, [place] with pelvic examination; collected specimens and sent to lab for testing

## 2011-01-04 NOTE — ED Notes (Signed)
Family at bedside. Pelvic to be completed by pa

## 2011-01-06 NOTE — ED Provider Notes (Signed)
Medical screening examination/treatment/procedure(s) were performed by non-physician practitioner and as supervising physician I was immediately available for consultation/collaboration.   Suzi Roots, MD 01/06/11 [phone]

## 2011-03-08 ENCOUNTER — Encounter (HOSPITAL_COMMUNITY): Payer: Self-pay | Admitting: *Deleted

## 2011-03-08 ENCOUNTER — Emergency Department (HOSPITAL_COMMUNITY)
Admission: EM | Admit: 2011-03-08 | Discharge: 2011-03-08 | Disposition: A | Discharge status: Home Health | Payer: Self-pay | Attending: Emergency Medicine | Admitting: Emergency Medicine

## 2011-03-08 DIAGNOSIS — G5603 Carpal tunnel syndrome, bilateral upper limbs: Secondary | ICD-10-CM

## 2011-03-08 DIAGNOSIS — M25539 Pain in unspecified wrist: Secondary | ICD-10-CM | POA: Insufficient documentation

## 2011-03-08 DIAGNOSIS — M79609 Pain in unspecified limb: Secondary | ICD-10-CM | POA: Insufficient documentation

## 2011-03-08 DIAGNOSIS — R209 Unspecified disturbances of skin sensation: Secondary | ICD-10-CM | POA: Insufficient documentation

## 2011-03-08 MED ORDER — NAPROXEN 500 MG PO TABS: 500.0000 mg | ORAL_TABLET | Freq: Two times a day (BID) | ORAL | Status: DC

## 2011-03-08 NOTE — Discharge Instructions (Signed)
TAKE NAPROSYN AS DIRECTED. COOL COMPRESSES TO THE WRISTS. FOLLOW UP WITH DR. Mina Marble FOR FURTHER EVALUATION IF PAIN PERSISTS.   Carpal Tunnel Syndrome The carpal tunnel is a narrow hollow area in the wrist. It is formed by the wrist bones and ligaments. Nerves, blood vessels, and tendons (cord like structures which attach muscle to bone) on the palm side (the side of your hand in the direction your fingers bend) of your hand pass through the carpal tunnel. Repeated wrist motion or certain diseases may cause swelling within the tunnel. (That is why these are called repetitive trauma (damage caused by over use) disorders. It is also a common problem in late pregnancy.) This swelling pinches the main nerve in the wrist (median nerve) and causes the painful condition called carpal tunnel syndrome. A feeling of "pins and needles" may be noticed in the fingers or hand; however, the entire arm may ache from this condition. Carpal tunnel syndrome may clear up by itself. Cortisone injections may help. Sometimes, an operation may be needed to free the pinched nerve. An electromyogram (a type of test) may be needed to confirm this diagnosis (learning what is wrong). This is a test which measures nerve conduction. The nerve conduction is usually slowed in a carpal tunnel syndrome. HOME CARE INSTRUCTIONS   If your caregiver prescribed medication to help reduce swelling, take as directed.   If you were given a splint to keep your wrist from bending, use it as instructed. It is important to wear the splint at night. Use the splint for as long as you have pain or numbness in your hand, arm or wrist. This may take 1 to 2 months.   If you have pain at night, it may help to rub or shake your hand, or elevate your hand above the level of your heart (the center of your chest).   It is important to give your wrist a rest by stopping the activities that are causing the problem. If your symptoms (problems) are work-related,  you may need to talk to your employer about changing to a job that does not require using your wrist.   Only take over-the-counter or prescription medicines for pain, discomfort, or fever as directed by your caregiver.   Following periods of extended use, particularly strenuous use, apply an ice pack wrapped in a towel to the anterior (palm) side of the affected wrist for 20 to 30 minutes. Repeat as needed three to four times per day. This will help reduce the swelling.   Follow all instructions for follow-up with your caregiver. This includes any orthopedic referrals, physical therapy, and rehabilitation. Any delay in obtaining necessary care could result in a delay or failure of your condition to heal.  SEEK IMMEDIATE MEDICAL CARE IF:   You are still having pain and numbness following a week of treatment.   You develop new, unexplained symptoms.   Your current symptoms are getting worse and are not helped or controlled with medications.  MAKE SURE YOU:   Understand these instructions.   Will watch your condition.   Will get help right away if you are not doing well or get worse.  Document Released: 12/19/1999 Document Revised: 12/10/2010 Document Reviewed: 11/06/2010 [facility] Patient Information 2012 [place], [place].

## 2011-03-08 NOTE — ED Notes (Signed)
Family at bedside. 

## 2011-03-08 NOTE — ED Notes (Addendum)
Patient reportedly braids hair.  She is having a cold sensation in both of her arms and hands.  She also complains of pain in both arm and hands

## 2011-03-08 NOTE — ED Provider Notes (Signed)
History     CSN: [id number]  Arrival date & time 03/08/11  9562   First MD Initiated Contact with Patient 03/08/11 1033      Chief Complaint  Patient presents with  . Hand Pain  . Arm Pain    (Consider location/radiation/quality/duration/timing/severity/associated sxs/prior treatment) Patient is a 26 y.o. female presenting with hand pain. The history is provided by the patient and the spouse.  Hand Pain This is a new problem. The current episode started more than 1 month ago. The problem occurs constantly. The problem has been gradually worsening. Pertinent negatives include no chills or fever. Associated symptoms comments: Pain in volar wrists that radiates into hand with pain and tingling. 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 Pain started about one month ago. No swelling. Pain can be worse at night. . Exacerbated by: Certain movements. She has tried nothing for the symptoms.    Past Medical History  Diagnosis Date  . No pertinent past medical history     Past Surgical History  Procedure Date  . Cesarean section     No family history on file.  History  Substance Use Topics  . Smoking status: Never Smoker   . Smokeless tobacco: Never Used  . Alcohol Use: No    OB History    Grav Para Term Preterm Abortions TAB SAB Ect Mult Living   3 2 2       2       Review of Systems  Constitutional: Negative for fever and chills.  HENT: Negative.   Respiratory: Negative.   Cardiovascular: Negative.   Gastrointestinal: Negative.   Musculoskeletal:       See HPI.  Skin: Negative.   Neurological: Negative.     Allergies  Tylenol  Home Medications   Current Outpatient Rx  Name Route Sig Dispense Refill  . IBUPROFEN 800 MG PO TABS Oral Take 800 mg by mouth daily as needed. As needed for pain.      BP 123/71  Pulse 74  Temp(Src) 98.2 F (36.8 C) (Oral)  Resp 16  SpO2 99%  LMP 06/18/2010  Breastfeeding? Unknown  Physical Exam  Constitutional: She is  oriented to person, place, and time. She appears well-developed and well-nourished.  Neck: Normal range of motion.  Pulmonary/Chest: Effort normal.  Musculoskeletal:       Hands bilaterally unremarkable in appearance. No swelling. Full range of motion with full strength. Capillary refill less than 2 sec. Phalens test positive for recurrent pain.  Neurological: She is alert and oriented to person, place, and time.  Skin: Skin is warm and dry.    ED Course  Procedures (including critical care time)  Labs Reviewed - No data to display No results found.   No diagnosis found.    MDM          Rodena Medin, PA-C 03/08/11 1114

## 2011-03-08 NOTE — ED Provider Notes (Signed)
Medical screening examination/treatment/procedure(s) were performed by non-physician practitioner and as supervising physician I was immediately available for consultation/collaboration.  Ayelet Gruenewald, MD 03/08/11 1744 

## 2011-07-19 LAB — OB RESULTS CONSOLE GC/CHLAMYDIA: Chlamydia: NEGATIVE

## 2011-07-26 ENCOUNTER — Other Ambulatory Visit (HOSPITAL_COMMUNITY): Payer: Self-pay | Admitting: Nurse Practitioner

## 2011-07-26 DIAGNOSIS — Z3689 Encounter for other specified antenatal screening: Secondary | ICD-10-CM

## 2011-07-26 LAB — OB RESULTS CONSOLE GC/CHLAMYDIA
Chlamydia: NEGATIVE
Gonorrhea: NEGATIVE

## 2011-07-26 LAB — OB RESULTS CONSOLE RUBELLA ANTIBODY, IGM
Rubella: IMMUNE
Rubella: IMMUNE
Rubella: IMMUNE

## 2011-07-26 LAB — OB RESULTS CONSOLE RPR
RPR: NONREACTIVE
RPR: NONREACTIVE
RPR: NONREACTIVE

## 2011-07-26 LAB — OB RESULTS CONSOLE HIV ANTIBODY (ROUTINE TESTING)
HIV: NONREACTIVE
HIV: NONREACTIVE

## 2011-07-26 LAB — OB RESULTS CONSOLE HEPATITIS B SURFACE ANTIGEN
Hepatitis B Surface Ag: NEGATIVE
Hepatitis B Surface Ag: NEGATIVE

## 2011-07-26 LAB — OB RESULTS CONSOLE ANTIBODY SCREEN: Antibody Screen: NEGATIVE

## 2011-08-02 ENCOUNTER — Ambulatory Visit (HOSPITAL_COMMUNITY)
Admission: RE | Admit: 2011-08-02 | Discharge: 2011-08-02 | Disposition: A | Discharge status: Home / Self Care | Payer: Medicaid Other | Source: Ambulatory Visit | Attending: Nurse Practitioner | Admitting: Nurse Practitioner

## 2011-08-02 DIAGNOSIS — Z1389 Encounter for screening for other disorder: Secondary | ICD-10-CM | POA: Insufficient documentation

## 2011-08-02 DIAGNOSIS — Z3689 Encounter for other specified antenatal screening: Secondary | ICD-10-CM

## 2011-08-02 DIAGNOSIS — Z363 Encounter for antenatal screening for malformations: Secondary | ICD-10-CM | POA: Insufficient documentation

## 2011-08-02 DIAGNOSIS — O358XX0 Maternal care for other (suspected) fetal abnormality and damage, not applicable or unspecified: Secondary | ICD-10-CM | POA: Insufficient documentation

## 2011-08-23 ENCOUNTER — Other Ambulatory Visit (HOSPITAL_COMMUNITY): Payer: Self-pay | Admitting: Family

## 2011-08-23 DIAGNOSIS — O4420 Partial placenta previa NOS or without hemorrhage, unspecified trimester: Secondary | ICD-10-CM

## 2011-08-23 DIAGNOSIS — Z0489 Encounter for examination and observation for other specified reasons: Secondary | ICD-10-CM

## 2011-08-30 ENCOUNTER — Ambulatory Visit (HOSPITAL_COMMUNITY)
Admission: RE | Admit: 2011-08-30 | Discharge: 2011-08-30 | Disposition: A | Discharge status: Home / Self Care | Payer: Medicaid Other | Source: Ambulatory Visit | Attending: Family | Admitting: Family

## 2011-08-30 DIAGNOSIS — Z0489 Encounter for examination and observation for other specified reasons: Secondary | ICD-10-CM

## 2011-08-30 DIAGNOSIS — O4420 Partial placenta previa NOS or without hemorrhage, unspecified trimester: Secondary | ICD-10-CM

## 2011-08-30 DIAGNOSIS — Z3689 Encounter for other specified antenatal screening: Secondary | ICD-10-CM | POA: Insufficient documentation

## 2012-01-03 ENCOUNTER — Other Ambulatory Visit (HOSPITAL_COMMUNITY): Payer: Self-pay | Admitting: Nurse Practitioner

## 2012-01-03 DIAGNOSIS — O3660X0 Maternal care for excessive fetal growth, unspecified trimester, not applicable or unspecified: Secondary | ICD-10-CM

## 2012-01-04 ENCOUNTER — Encounter (HOSPITAL_COMMUNITY): Payer: Self-pay

## 2012-01-04 ENCOUNTER — Ambulatory Visit (HOSPITAL_COMMUNITY)
Admission: RE | Admit: 2012-01-04 | Discharge: 2012-01-04 | Disposition: A | Discharge status: Home / Self Care | Payer: Medicaid Other | Source: Ambulatory Visit | Attending: Nurse Practitioner | Admitting: Nurse Practitioner

## 2012-01-04 DIAGNOSIS — O3660X0 Maternal care for excessive fetal growth, unspecified trimester, not applicable or unspecified: Secondary | ICD-10-CM | POA: Insufficient documentation

## 2012-01-04 DIAGNOSIS — O34219 Maternal care for unspecified type scar from previous cesarean delivery: Secondary | ICD-10-CM | POA: Insufficient documentation

## 2012-01-11 ENCOUNTER — Inpatient Hospital Stay (HOSPITAL_COMMUNITY)
Admission: RE | Admit: 2012-01-11 | Discharge: 2012-01-11 | DRG: 782 | Disposition: A | Discharge status: Home / Self Care | Payer: Medicaid Other | Source: Ambulatory Visit | Attending: Obstetrics & Gynecology | Admitting: Obstetrics & Gynecology

## 2012-01-11 ENCOUNTER — Inpatient Hospital Stay (HOSPITAL_COMMUNITY): Payer: Medicaid Other

## 2012-01-11 ENCOUNTER — Encounter (HOSPITAL_COMMUNITY): Payer: Self-pay

## 2012-01-11 DIAGNOSIS — O409XX0 Polyhydramnios, unspecified trimester, not applicable or unspecified: Principal | ICD-10-CM | POA: Diagnosis present

## 2012-01-11 DIAGNOSIS — O403XX0 Polyhydramnios, third trimester, not applicable or unspecified: Secondary | ICD-10-CM | POA: Diagnosis present

## 2012-01-11 DIAGNOSIS — O09899 Supervision of other high risk pregnancies, unspecified trimester: Secondary | ICD-10-CM

## 2012-01-11 HISTORY — DX: Headache: R51

## 2012-01-11 MED ORDER — LACTATED RINGERS IV SOLN: INTRAVENOUS | Status: DC

## 2012-01-11 NOTE — Progress Notes (Signed)
Patient ID: [name], female   DOB: 1985-03-08, 27 y.o.   MRN: [id number]  HPI: Ann Clarke is a 27 y.o. year old G68P2012 female at [redacted]w[redacted]d weeks gestation who presents to [facility] for IOL for Polyhydramnios. She received care at [facility]. Growth US performed for S>D. EFW 8-3 (3700gm), AFI 25.57 cm. Dated by 15.5 week [place]. Hx C/S for Breech and successful VBAC. Desires TOLAC.   On thorough review of records pt will not be 39 weeks until tomorrow, and there is not a medical indication for IOL prior to 30 weeks.  AFI 26 cm and BPP 8/8. EFM category I Dilation: 1 Effacement (%): 60 Presentation: Vertex Exam by:: Dorathy Kinsman, CNM  Discussed situation and results w/ Dr. Debroah Loop who agrees. Will D/C pt home and have her return tomorrow night for foley bulb IOL. Explained to pt and husband (who declined interpreter). They verbalized understanding.  Labor precautions and FCKs.  [place], [place] 01/11/2012 12:44 PM

## 2012-01-11 NOTE — H&P (Signed)
  HPI: Janell Keeling is a 27 y.o. year old G32P2012 female at [redacted]w[redacted]d weeks gestation who presents to [facility] for IOL for Polyhydramnios. She received care at [facility] Dept. Growth US performed for S>D. EFW 8-3 (3700gm), AFI 25.57 cm. Dated by 15.5 week [place]. Hx C/S for Breech and successful VBAC. Desires TOLAC.  On thorough review of records pt will not be 39 weeks until tomorrow, and there is not a medical indication for IOL prior to 30 weeks.  AFI 26 cm and BPP 8/8.  EFM category I  Dilation: 1  Effacement (%): 60  Presentation: Vertex  Exam by:: Dorathy Kinsman, CNM  Discussed situation and results w/ Dr. Debroah Loop who agrees. Will D/C pt home and have her return tomorrow night for foley bulb IOL. Explained to pt and husband (who declined interpreter). They verbalized understanding.  Labor precautions and FCKs.  [place], [place]  01/11/2012  12:44 PM

## 2012-01-11 NOTE — Discharge Summary (Signed)
Discharge Summary Reason for Admission: induction of labor. Not indicated until 39 weeks Prenatal Procedures: NST, ultrasound and BPP Complications: none Hemoglobin  Date Value Range Status  01/04/2011 12.6  12.0 - 15.0 g/dL Final     HCT  Date Value Range Status  01/04/2011 37.0  36.0 - 46.0 % Final    Physical Exam:  General: alert, cooperative, appears stated age and no distress  Discharge Diagnoses: Polyhydranmios undelivered.  Discharge Information: Date: 01/11/2012 Activity: unrestricted Diet: routine Medications: PNV Condition: stable Instructions: return tomorrow night at 7:15 for Induction. FKCs and labor precautions.  Discharge to: home Follow-up Information    Follow up with [facility] [place] [facility]. On 01/12/2012. (Come to registration desk at 7:15 PM. You may eat dinner.  )    Contact information:   [address] [id number] mc [address] [place] [address] [phone]         Dorathy Kinsman 01/11/2012, 12:55 PM

## 2012-01-12 ENCOUNTER — Inpatient Hospital Stay (HOSPITAL_COMMUNITY)
Admission: RE | Admit: 2012-01-12 | Discharge: 2012-01-16 | DRG: 766 | Disposition: A | Discharge status: Home / Self Care | Payer: Medicaid Other | Source: Ambulatory Visit | Attending: Obstetrics & Gynecology | Admitting: Obstetrics & Gynecology

## 2012-01-12 ENCOUNTER — Telehealth (HOSPITAL_COMMUNITY): Payer: Self-pay | Admitting: *Deleted

## 2012-01-12 VITALS — BP 101/64 | HR 73 | Temp 98.2°F | Resp 18 | Ht 59.0 in | Wt 185.0 lb

## 2012-01-12 DIAGNOSIS — O409XX0 Polyhydramnios, unspecified trimester, not applicable or unspecified: Principal | ICD-10-CM | POA: Diagnosis present

## 2012-01-12 DIAGNOSIS — O09899 Supervision of other high risk pregnancies, unspecified trimester: Secondary | ICD-10-CM

## 2012-01-12 DIAGNOSIS — Z302 Encounter for sterilization: Secondary | ICD-10-CM

## 2012-01-12 DIAGNOSIS — O34219 Maternal care for unspecified type scar from previous cesarean delivery: Secondary | ICD-10-CM | POA: Diagnosis present

## 2012-01-12 DIAGNOSIS — O403XX0 Polyhydramnios, third trimester, not applicable or unspecified: Secondary | ICD-10-CM

## 2012-01-12 LAB — CBC
HCT: 35.1 % — ABNORMAL LOW (ref 36.0–46.0)
Hemoglobin: 11.8 g/dL — ABNORMAL LOW (ref 12.0–15.0)
MCH: 30.6 pg (ref 26.0–34.0)
MCHC: 33.6 g/dL (ref 30.0–36.0)
RDW: 13.9 % (ref 11.5–15.5)

## 2012-01-12 MED ORDER — LIDOCAINE HCL (PF) 1 % IJ SOLN: 30.0000 mL | INTRAMUSCULAR | Status: DC | PRN

## 2012-01-12 MED ORDER — LACTATED RINGERS IV SOLN
INTRAVENOUS | Status: DC
  Administered 2012-01-13 (×2): via INTRAVENOUS

## 2012-01-12 MED ORDER — CITRIC ACID-SODIUM CITRATE 334-500 MG/5ML PO SOLN
30.0000 mL | ORAL | Status: DC | PRN
  Administered 2012-01-13: 30 mL via ORAL
  Filled 2012-01-12: qty 15

## 2012-01-12 MED ORDER — ZOLPIDEM TARTRATE 5 MG PO TABS
5.0000 mg | ORAL_TABLET | Freq: Every evening | ORAL | Status: DC | PRN
  Administered 2012-01-12: 5 mg via ORAL
  Filled 2012-01-12: qty 1

## 2012-01-12 MED ORDER — OXYTOCIN BOLUS FROM INFUSION: 500.0000 mL | INTRAVENOUS | Status: DC

## 2012-01-12 MED ORDER — LACTATED RINGERS IV SOLN: 500.0000 mL | INTRAVENOUS | Status: DC | PRN

## 2012-01-12 MED ORDER — FLEET ENEMA 7-19 GM/118ML RE ENEM: 1.0000 | ENEMA | RECTAL | Status: DC | PRN

## 2012-01-12 MED ORDER — ONDANSETRON HCL 4 MG/2ML IJ SOLN: 4.0000 mg | Freq: Four times a day (QID) | INTRAMUSCULAR | Status: DC | PRN

## 2012-01-12 MED ORDER — OXYTOCIN 40 UNITS IN LACTATED RINGERS INFUSION - SIMPLE MED: 62.5000 mL/h | INTRAVENOUS | Status: DC

## 2012-01-12 MED ORDER — IBUPROFEN 600 MG PO TABS: 600.0000 mg | ORAL_TABLET | Freq: Four times a day (QID) | ORAL | Status: DC | PRN

## 2012-01-12 NOTE — Progress Notes (Signed)
Foley placed and inflated with 60cc H20.  FHR 140's, reactive without decels.  Occasional contraction.  Will start pitocin when foley falls out

## 2012-01-12 NOTE — Telephone Encounter (Signed)
Preadmission screen [phone] interpreter

## 2012-01-12 NOTE — H&P (Signed)
HPI: : Ann Clarke is a 27 y.o. year old G80P2012 female at 39.[redacted] weeks gestation who presents to [facility] for IOL for Polyhydramnios. She received care at [facility] Dept. Growth US performed for S>D. EFW 8-3 (3700gm), AFI 25.57 cm. Dated by 15.5 week [place]. Hx C/S for Breech and successful VBAC. Desires TOLAC   Subjective:    Patient reports no complaints.   Fetal Movement: normal.     Objective:   Vital signs in last 24 hours: Temp:  [98.3 F (36.8 C)] 98.3 F (36.8 C) (01/08 1955) Pulse Rate:  [84] 84  (01/08 1956) Resp:  [20] 20  (01/08 1955) BP: (116)/(57) 116/57 mmHg (01/08 1956)   General:   alert, cooperative and no distress  Skin:   normal  HEENT:  PERRLA  Lungs:   clear to auscultation bilaterally  Heart:   regular rate and rhythm  Breasts:   n/a  Abdomen:  normal findings: size>dates  Pelvis: adequate  FHT:  140  BPM, avg LTV, + accels, no decels.  No contractions  Uterine Size: size greater than dates  Presentations: cephalic  Cervix:    Dilation: 1cm   Effacement: 50%   Station:  -3   Consistency: medium   Position: posterior   Lab Review  O, Rh+  [id number]  One hour GTT: 106 HbSAG neg HIV neg GBS neg RPR neg    Assessment/Plan:  39 and 0/[redacted] weeks gestation. IOL for polyhydramnios  TOLAC   Will ripen cervix with Foley then proceed to Pitocin

## 2012-01-13 ENCOUNTER — Encounter (HOSPITAL_COMMUNITY): Payer: Self-pay

## 2012-01-13 ENCOUNTER — Encounter (HOSPITAL_COMMUNITY): Payer: Self-pay | Admitting: Anesthesiology

## 2012-01-13 ENCOUNTER — Encounter (HOSPITAL_COMMUNITY)
Admission: RE | Disposition: A | Discharge status: Home / Self Care | Payer: Self-pay | Source: Ambulatory Visit | Attending: Obstetrics & Gynecology

## 2012-01-13 DIAGNOSIS — O409XX0 Polyhydramnios, unspecified trimester, not applicable or unspecified: Secondary | ICD-10-CM

## 2012-01-13 DIAGNOSIS — Z302 Encounter for sterilization: Secondary | ICD-10-CM

## 2012-01-13 DIAGNOSIS — O34219 Maternal care for unspecified type scar from previous cesarean delivery: Secondary | ICD-10-CM

## 2012-01-13 LAB — RPR: RPR Ser Ql: NONREACTIVE

## 2012-01-13 LAB — PREPARE RBC (CROSSMATCH)

## 2012-01-13 SURGERY — Surgical Case
Anesthesia: Spinal | Site: Abdomen | Wound class: Clean Contaminated

## 2012-01-13 MED ORDER — SODIUM CHLORIDE 0.9 % IJ SOLN: 3.0000 mL | INTRAMUSCULAR | Status: DC | PRN

## 2012-01-13 MED ORDER — SENNOSIDES-DOCUSATE SODIUM 8.6-50 MG PO TABS
2.0000 | ORAL_TABLET | Freq: Every day | ORAL | Status: DC
  Administered 2012-01-14 – 2012-01-15 (×2): 2 via ORAL

## 2012-01-13 MED ORDER — FENTANYL CITRATE 0.05 MG/ML IJ SOLN
INTRAMUSCULAR | Status: AC
  Administered 2012-01-13: 25 ug via INTRAVENOUS
  Filled 2012-01-13: qty 2

## 2012-01-13 MED ORDER — BUPIVACAINE IN DEXTROSE 0.75-8.25 % IT SOLN
INTRATHECAL | Status: DC | PRN
  Administered 2012-01-13: 1.1 mL via INTRATHECAL

## 2012-01-13 MED ORDER — OXYTOCIN 10 UNIT/ML IJ SOLN
INTRAMUSCULAR | Status: AC
  Filled 2012-01-13: qty 4

## 2012-01-13 MED ORDER — LACTATED RINGERS IV SOLN
INTRAVENOUS | Status: DC
  Administered 2012-01-13 – 2012-01-14 (×2): via INTRAVENOUS

## 2012-01-13 MED ORDER — LACTATED RINGERS IV SOLN
INTRAVENOUS | Status: DC | PRN
  Administered 2012-01-13 (×2): via INTRAVENOUS

## 2012-01-13 MED ORDER — OXYTOCIN 40 UNITS IN LACTATED RINGERS INFUSION - SIMPLE MED: 62.5000 mL/h | INTRAVENOUS | Status: AC

## 2012-01-13 MED ORDER — FENTANYL CITRATE 0.05 MG/ML IJ SOLN
INTRAMUSCULAR | Status: AC
  Filled 2012-01-13: qty 2

## 2012-01-13 MED ORDER — MEPERIDINE HCL 25 MG/ML IJ SOLN: 6.2500 mg | INTRAMUSCULAR | Status: DC | PRN

## 2012-01-13 MED ORDER — SCOPOLAMINE 1 MG/3DAYS TD PT72
MEDICATED_PATCH | TRANSDERMAL | Status: AC
  Administered 2012-01-13: 1.5 mg via TRANSDERMAL
  Filled 2012-01-13: qty 1

## 2012-01-13 MED ORDER — EPHEDRINE 5 MG/ML INJ
INTRAVENOUS | Status: AC
  Filled 2012-01-13: qty 10

## 2012-01-13 MED ORDER — SCOPOLAMINE 1 MG/3DAYS TD PT72
1.0000 | MEDICATED_PATCH | Freq: Once | TRANSDERMAL | Status: DC
  Administered 2012-01-13: 1.5 mg via TRANSDERMAL

## 2012-01-13 MED ORDER — PENICILLIN G POTASSIUM 5000000 UNITS IJ SOLR
5.0000 10*6.[IU] | Freq: Once | INTRAVENOUS | Status: AC
  Administered 2012-01-13: 5 10*6.[IU] via INTRAVENOUS
  Filled 2012-01-13: qty 5

## 2012-01-13 MED ORDER — CEFAZOLIN SODIUM-DEXTROSE 2-3 GM-% IV SOLR
INTRAVENOUS | Status: DC | PRN
  Administered 2012-01-13: 2 g via INTRAVENOUS

## 2012-01-13 MED ORDER — ONDANSETRON HCL 4 MG/2ML IJ SOLN: 4.0000 mg | Freq: Three times a day (TID) | INTRAMUSCULAR | Status: DC | PRN

## 2012-01-13 MED ORDER — LANOLIN HYDROUS EX OINT: 1.0000 "application " | TOPICAL_OINTMENT | CUTANEOUS | Status: DC | PRN

## 2012-01-13 MED ORDER — SIMETHICONE 80 MG PO CHEW
80.0000 mg | CHEWABLE_TABLET | Freq: Three times a day (TID) | ORAL | Status: DC
  Administered 2012-01-14 – 2012-01-16 (×8): 80 mg via ORAL

## 2012-01-13 MED ORDER — NALOXONE HCL 0.4 MG/ML IJ SOLN: 0.4000 mg | INTRAMUSCULAR | Status: DC | PRN

## 2012-01-13 MED ORDER — DIPHENHYDRAMINE HCL 25 MG PO CAPS: 25.0000 mg | ORAL_CAPSULE | Freq: Four times a day (QID) | ORAL | Status: DC | PRN

## 2012-01-13 MED ORDER — IBUPROFEN 600 MG PO TABS: 600.0000 mg | ORAL_TABLET | Freq: Four times a day (QID) | ORAL | Status: DC | PRN

## 2012-01-13 MED ORDER — CEFAZOLIN SODIUM-DEXTROSE 2-3 GM-% IV SOLR
2.0000 g | INTRAVENOUS | Status: DC
  Filled 2012-01-13: qty 50

## 2012-01-13 MED ORDER — MORPHINE SULFATE (PF) 0.5 MG/ML IJ SOLN
INTRAMUSCULAR | Status: DC | PRN
  Administered 2012-01-13: .1 mg via INTRATHECAL

## 2012-01-13 MED ORDER — DIBUCAINE 1 % RE OINT: 1.0000 "application " | TOPICAL_OINTMENT | RECTAL | Status: DC | PRN

## 2012-01-13 MED ORDER — KETOROLAC TROMETHAMINE 60 MG/2ML IM SOLN
INTRAMUSCULAR | Status: AC
  Administered 2012-01-13: 60 mg via INTRAMUSCULAR
  Filled 2012-01-13: qty 2

## 2012-01-13 MED ORDER — FENTANYL CITRATE 0.05 MG/ML IJ SOLN
50.0000 ug | INTRAMUSCULAR | Status: DC | PRN
  Filled 2012-01-13: qty 2

## 2012-01-13 MED ORDER — TETANUS-DIPHTH-ACELL PERTUSSIS 5-2.5-18.5 LF-MCG/0.5 IM SUSP
0.5000 mL | Freq: Once | INTRAMUSCULAR | Status: AC
  Administered 2012-01-14: 0.5 mL via INTRAMUSCULAR
  Filled 2012-01-13: qty 0.5

## 2012-01-13 MED ORDER — PHENYLEPHRINE 40 MCG/ML (10ML) SYRINGE FOR IV PUSH (FOR BLOOD PRESSURE SUPPORT)
PREFILLED_SYRINGE | INTRAVENOUS | Status: AC
  Filled 2012-01-13: qty 10

## 2012-01-13 MED ORDER — PHENYLEPHRINE HCL 10 MG/ML IJ SOLN
INTRAMUSCULAR | Status: DC | PRN
  Administered 2012-01-13 (×3): 80 ug via INTRAVENOUS
  Administered 2012-01-13: 40 ug via INTRAVENOUS
  Administered 2012-01-13: 80 ug via INTRAVENOUS
  Administered 2012-01-13 (×3): 40 ug via INTRAVENOUS

## 2012-01-13 MED ORDER — ONDANSETRON HCL 4 MG/2ML IJ SOLN
INTRAMUSCULAR | Status: AC
  Filled 2012-01-13: qty 2

## 2012-01-13 MED ORDER — MORPHINE SULFATE 0.5 MG/ML IJ SOLN
INTRAMUSCULAR | Status: AC
  Filled 2012-01-13: qty 10

## 2012-01-13 MED ORDER — EPHEDRINE SULFATE 50 MG/ML IJ SOLN
INTRAMUSCULAR | Status: DC | PRN
  Administered 2012-01-13 (×5): 10 mg via INTRAVENOUS

## 2012-01-13 MED ORDER — OXYTOCIN 10 UNIT/ML IJ SOLN
40.0000 [IU] | INTRAVENOUS | Status: DC | PRN
  Administered 2012-01-13: 40 [IU] via INTRAVENOUS

## 2012-01-13 MED ORDER — FENTANYL CITRATE 0.05 MG/ML IJ SOLN
100.0000 ug | INTRAMUSCULAR | Status: DC | PRN
  Administered 2012-01-13 (×3): 100 ug via INTRAVENOUS
  Filled 2012-01-13 (×2): qty 2

## 2012-01-13 MED ORDER — DIPHENHYDRAMINE HCL 25 MG PO CAPS: 25.0000 mg | ORAL_CAPSULE | ORAL | Status: DC | PRN

## 2012-01-13 MED ORDER — KETOROLAC TROMETHAMINE 30 MG/ML IJ SOLN
30.0000 mg | Freq: Four times a day (QID) | INTRAMUSCULAR | Status: AC | PRN
  Administered 2012-01-14: 30 mg via INTRAVENOUS
  Filled 2012-01-13: qty 1

## 2012-01-13 MED ORDER — PRENATAL MULTIVITAMIN CH
1.0000 | ORAL_TABLET | Freq: Every day | ORAL | Status: DC
  Administered 2012-01-14 – 2012-01-16 (×3): 1 via ORAL
  Filled 2012-01-13 (×3): qty 1

## 2012-01-13 MED ORDER — ZOLPIDEM TARTRATE 5 MG PO TABS: 5.0000 mg | ORAL_TABLET | Freq: Every evening | ORAL | Status: DC | PRN

## 2012-01-13 MED ORDER — TERBUTALINE SULFATE 1 MG/ML IJ SOLN: 0.2500 mg | Freq: Once | INTRAMUSCULAR | Status: DC | PRN

## 2012-01-13 MED ORDER — ONDANSETRON HCL 4 MG PO TABS: 4.0000 mg | ORAL_TABLET | ORAL | Status: DC | PRN

## 2012-01-13 MED ORDER — NALBUPHINE HCL 10 MG/ML IJ SOLN: 5.0000 mg | INTRAMUSCULAR | Status: DC | PRN

## 2012-01-13 MED ORDER — IBUPROFEN 600 MG PO TABS
600.0000 mg | ORAL_TABLET | Freq: Four times a day (QID) | ORAL | Status: DC
  Administered 2012-01-14 – 2012-01-16 (×10): 600 mg via ORAL
  Filled 2012-01-13 (×10): qty 1

## 2012-01-13 MED ORDER — KETOROLAC TROMETHAMINE 60 MG/2ML IM SOLN
60.0000 mg | Freq: Once | INTRAMUSCULAR | Status: AC | PRN
  Administered 2012-01-13: 60 mg via INTRAMUSCULAR

## 2012-01-13 MED ORDER — WITCH HAZEL-GLYCERIN EX PADS: 1.0000 "application " | MEDICATED_PAD | CUTANEOUS | Status: DC | PRN

## 2012-01-13 MED ORDER — BUTORPHANOL TARTRATE 1 MG/ML IJ SOLN: 1.0000 mg | INTRAMUSCULAR | Status: DC | PRN

## 2012-01-13 MED ORDER — DIPHENHYDRAMINE HCL 50 MG/ML IJ SOLN: 12.5000 mg | INTRAMUSCULAR | Status: DC | PRN

## 2012-01-13 MED ORDER — DIPHENHYDRAMINE HCL 50 MG/ML IJ SOLN: 25.0000 mg | INTRAMUSCULAR | Status: DC | PRN

## 2012-01-13 MED ORDER — PENICILLIN G POTASSIUM 5000000 UNITS IJ SOLR
2.5000 10*6.[IU] | INTRAVENOUS | Status: DC
  Administered 2012-01-13 (×2): 2.5 10*6.[IU] via INTRAVENOUS
  Filled 2012-01-13 (×5): qty 2.5

## 2012-01-13 MED ORDER — PHENYLEPHRINE HCL 10 MG/ML IJ SOLN: INTRAMUSCULAR | Status: DC | PRN

## 2012-01-13 MED ORDER — FENTANYL CITRATE 0.05 MG/ML IJ SOLN
25.0000 ug | INTRAMUSCULAR | Status: DC | PRN
  Administered 2012-01-13 (×2): 25 ug via INTRAVENOUS

## 2012-01-13 MED ORDER — SIMETHICONE 80 MG PO CHEW: 80.0000 mg | CHEWABLE_TABLET | ORAL | Status: DC | PRN

## 2012-01-13 MED ORDER — ONDANSETRON HCL 4 MG/2ML IJ SOLN
INTRAMUSCULAR | Status: DC | PRN
  Administered 2012-01-13: 4 mg via INTRAVENOUS

## 2012-01-13 MED ORDER — PHENYLEPHRINE 40 MCG/ML (10ML) SYRINGE FOR IV PUSH (FOR BLOOD PRESSURE SUPPORT)
PREFILLED_SYRINGE | INTRAVENOUS | Status: AC
  Filled 2012-01-13: qty 5

## 2012-01-13 MED ORDER — MENTHOL 3 MG MT LOZG: 1.0000 | LOZENGE | OROMUCOSAL | Status: DC | PRN

## 2012-01-13 MED ORDER — OXYTOCIN 40 UNITS IN LACTATED RINGERS INFUSION - SIMPLE MED
1.0000 m[IU]/min | INTRAVENOUS | Status: DC
  Administered 2012-01-13: 1 m[IU]/min via INTRAVENOUS
  Filled 2012-01-13: qty 1000

## 2012-01-13 MED ORDER — KETOROLAC TROMETHAMINE 30 MG/ML IJ SOLN: 30.0000 mg | Freq: Four times a day (QID) | INTRAMUSCULAR | Status: AC | PRN

## 2012-01-13 MED ORDER — OXYCODONE HCL 5 MG PO TABS
5.0000 mg | ORAL_TABLET | ORAL | Status: DC | PRN
  Administered 2012-01-15 (×2): 5 mg via ORAL
  Administered 2012-01-16: 10 mg via ORAL
  Filled 2012-01-13 (×2): qty 1
  Filled 2012-01-13: qty 2

## 2012-01-13 MED ORDER — NALOXONE HCL 1 MG/ML IJ SOLN: 1.0000 ug/kg/h | INTRAVENOUS | Status: DC | PRN

## 2012-01-13 MED ORDER — METOCLOPRAMIDE HCL 5 MG/ML IJ SOLN: 10.0000 mg | Freq: Three times a day (TID) | INTRAMUSCULAR | Status: DC | PRN

## 2012-01-13 MED ORDER — ONDANSETRON HCL 4 MG/2ML IJ SOLN: 4.0000 mg | INTRAMUSCULAR | Status: DC | PRN

## 2012-01-13 MED ORDER — FENTANYL CITRATE 0.05 MG/ML IJ SOLN
INTRAMUSCULAR | Status: DC | PRN
  Administered 2012-01-13: 15 ug via INTRATHECAL

## 2012-01-13 SURGICAL SUPPLY — 33 items
BENZOIN TINCTURE PRP APPL 2/3 (GAUZE/BANDAGES/DRESSINGS) ×2 IMPLANT
CLOTH BEACON ORANGE TIMEOUT ST (SAFETY) ×2 IMPLANT
CONTAINER PREFILL 10% NBF 15ML (MISCELLANEOUS) IMPLANT
DRAIN JACKSON PRT FLT 7MM (DRAIN) IMPLANT
DRAPE LG THREE QUARTER DISP (DRAPES) IMPLANT
DRESSING TELFA 8X3 (GAUZE/BANDAGES/DRESSINGS) IMPLANT
DRSG OPSITE POSTOP 4X10 (GAUZE/BANDAGES/DRESSINGS) ×2 IMPLANT
DURAPREP 26ML APPLICATOR (WOUND CARE) ×2 IMPLANT
ELECT REM PT RETURN 9FT ADLT (ELECTROSURGICAL) ×2
ELECTRODE REM PT RTRN 9FT ADLT (ELECTROSURGICAL) ×1 IMPLANT
EVACUATOR SILICONE 100CC (DRAIN) IMPLANT
EXTRACTOR VACUUM M CUP 4 TUBE (SUCTIONS) IMPLANT
GAUZE SPONGE 4X4 12PLY STRL LF (GAUZE/BANDAGES/DRESSINGS) ×2 IMPLANT
GLOVE BIO SURGEON STRL SZ7 (GLOVE) ×2 IMPLANT
GLOVE BIOGEL PI IND STRL 7.0 (GLOVE) ×2 IMPLANT
GLOVE BIOGEL PI INDICATOR 7.0 (GLOVE) ×2
GOWN PREVENTION PLUS LG XLONG (DISPOSABLE) ×6 IMPLANT
KIT ABG SYR 3ML LUER SLIP (SYRINGE) IMPLANT
NEEDLE HYPO 25X5/8 SAFETYGLIDE (NEEDLE) ×2 IMPLANT
NS IRRIG 1000ML POUR BTL (IV SOLUTION) ×2 IMPLANT
PACK C SECTION WH (CUSTOM PROCEDURE TRAY) ×2 IMPLANT
PAD ABD 7.5X8 STRL (GAUZE/BANDAGES/DRESSINGS) IMPLANT
PAD OB MATERNITY 4.3X12.25 (PERSONAL CARE ITEMS) ×2 IMPLANT
RTRCTR C-SECT PINK 25CM LRG (MISCELLANEOUS) ×2 IMPLANT
SLEEVE SCD COMPRESS KNEE MED (MISCELLANEOUS) IMPLANT
STAPLER VISISTAT 35W (STAPLE) IMPLANT
STRIP CLOSURE SKIN 1/2X4 (GAUZE/BANDAGES/DRESSINGS) ×2 IMPLANT
SUT VIC AB 0 CTX 36 (SUTURE) ×4
SUT VIC AB 0 CTX36XBRD ANBCTRL (SUTURE) ×4 IMPLANT
SUT VIC AB 4-0 KS 27 (SUTURE) ×2 IMPLANT
TOWEL OR 17X24 6PK STRL BLUE (TOWEL DISPOSABLE) ×4 IMPLANT
TRAY FOLEY CATH 14FR (SET/KITS/TRAYS/PACK) ×2 IMPLANT
WATER STERILE IRR 1000ML POUR (IV SOLUTION) IMPLANT

## 2012-01-13 NOTE — Consult Note (Signed)
Neonatology Note:   Attendance at C-section:    I was asked to attend this repeat C/S at term after failed TOLAC, FTP. The mother is a G4P3 O pos, GBS pos with polyhydramnios. ROM 5 hours prior to delivery, fluid clear. CAN times 1 loosely Infant vigorous with good spontaneous cry and tone. Needed only minimal bulb suctioning. Ap 8/9. Lungs clear to ausc in DR. Small accessory nipple noted on left side. To CN to care of Pediatrician.   Deatra James, MD

## 2012-01-13 NOTE — Anesthesia Procedure Notes (Signed)
Spinal  Patient location during procedure: OR Start time: 01/13/2012 6:44 PM Staffing Performed by: anesthesiologist  Preanesthetic Checklist Completed: patient identified, site marked, surgical consent, pre-op evaluation, timeout performed, IV checked, risks and benefits discussed and monitors and equipment checked Spinal Block Patient position: sitting Prep: site prepped and draped and DuraPrep Patient monitoring: heart rate, cardiac monitor, continuous pulse ox and blood pressure Approach: midline Location: L3-4 Injection technique: single-shot Needle Needle type: Sprotte  Needle gauge: 24 G Needle length: 9 cm Assessment Sensory level: T4 Additional Notes Clear free flow CSF on first attempt.  Transient paresthesia.  Patient tolerated procedure well.  Jasmine December, MD

## 2012-01-13 NOTE — Op Note (Signed)
Ann Clarke   PROCEDURE DATE: 01/13/2012   PREOPERATIVE DIAGNOSIS: Intrauterine pregnancy at [redacted]w[redacted]d gestation; previous cesarean section; declined TOLAC after initial attempt; undesired fertility  POSTOPERATIVE DIAGNOSIS: The same  PROCEDURE: Repeat Low Transverse Cesarean Section, Bilateral Tubal Sterilization using Filshie clips  SURGEON:  Elsie Lincoln, MD  ASSISTANT:  Napoleon Form, MD  INDICATIONS: Ann Clarke is a 27 y.o. [phone] at [redacted]w[redacted]d presenting for induction of labor for polyhydramnios. She had one prior cesarean section and one successful VBAC and initially desired TOLAC. She underwent cervical ripening with foley bulb then induction with pitocin. She reached 4.5 cm dilation, with minus 3 station and did not change further for several hours. At that time she opted to proceed with repeat cesarean section. She also desired permanent sterilization with bilateral tubal ligation. She was therefore taken to the OR for for cesarean section and bilateral tubal sterilization secondary to the indications listed under preoperative diagnosis; please see preoperative note for further details.  The risks of surgery were discussed with the patient including but were not limited to: bleeding which may require transfusion or reoperation; infection which may require antibiotics; injury to bowel, bladder, ureters or other surrounding organs; injury to the fetus; need for additional procedures including hysterectomy in the event of a life-threatening hemorrhage; placental abnormalities wth subsequent pregnancies, incisional problems, thromboembolic phenomenon and other postoperative/anesthesia complications.  Patient also desires permanent sterilization.  Other reversible forms of contraception were discussed with patient; she declines all other modalities. Risks of procedure discussed with patient including but not limited to: risk of regret, permanence of method, bleeding, infection, injury to surrounding  organs and need for additional procedures.  Failure risk of 0.5-1% with increased risk of ectopic gestation if pregnancy occurs was also discussed with patient.  The patient concurred with the proposed plan, giving informed written consent for the procedures.    FINDINGS:  Viable female infant in cephalic presentation.  Apgars 8 and 9.  Clear amniotic fluid.  Intact placenta; nuchal cord x 1, three vessel.  Normal uterus, fallopian tubes and ovaries bilaterally.  ANESTHESIA: Spinal INTRAVENOUS FLUIDS: 2000 ml ESTIMATED BLOOD LOSS: 700 ml URINE OUTPUT:  350 ml SPECIMENS: Placenta sent to L&D COMPLICATIONS: None immediate  PROCEDURE IN DETAIL:  The patient preoperatively received intravenous antibiotics and had sequential compression devices applied to her lower extremities.  She was then taken to the operating room where spinal anesthesia was administered and was found to be adequate. She was then placed in a dorsal supine position with a leftward tilt, and prepped and draped in a sterile manner.  A foley catheter was placed into her bladder and attached to constant gravity.  After an adequate timeout was performed, a Pfannenstiel skin incision was made with scalpel and carried through to the underlying layer of fascia. The fascia was incised in the midline, and this incision was extended bilaterally using the Mayo scissors.  Kocher clamps were applied to the superior aspect of the fascial incision and the underlying rectus muscles were dissected off bluntly. A similar process was carried out on the inferior aspect of the fascial incision. The rectus muscles were separated in the midline bluntly and the peritoneum was entered bluntly. Significant scar tissue was encountered limiting accessibility to the lower uterine segment. The middle 20% of the rectus muscles were separated in transverse fashion using electrosurgical technique. Excellent hemostasis was maintained. The peritoneal incision was extended  bilaterally and curved slightly upwards with the Bovie, taking care to avoid the bladder. An [employer]  self-retaining retractor was placed and a bladder blade was placed. Attention was turned to the lower uterine segment where a low transverse hysterotomy was made with a scalpel and extended bilaterally bluntly. The infant was successfully delivered, the cord was clamped and cut and the infant was handed over to awaiting neonatology team. Uterine massage was then administered, and the placenta delivered intact with a three-vessel cord. The uterus was then cleared of clot and debris.  The hysterotomy was closed with 0 Vicryl in a single layer in running locked fashion.  Attention was then turned to the fallopian tubes.  A Filshie clip was placed on both tubes, about 3 cm from the cornua, with care given to incorporate the underlying mesosalpinx on both sides, allowing for bilateral tubal sterilization. The pelvis was cleared of all clot and debris. Hemostasis was confirmed on all surfaces. The fascia was then closed using 0 Vicryl in a running fashion.  The subcutaneous layer was irrigated, and the skin was closed with a 4-0 Vicryl subcuticular stitch.  The patient tolerated the procedure well. The foley catheter drained clear urine throughout the procedure. Sponge, lap, instrument and needle counts were correct x 2.  The patient was taken to the recovery room in stable condition.   Napoleon Form, MD 01/13/2012 7:56 PM

## 2012-01-13 NOTE — Progress Notes (Signed)
Ann Clarke is a 27 y.o. [id number] at [redacted]w[redacted]d by  admitted for induction of labor due to Hydramnios.  Subjective:   Objective: BP 109/78  Pulse 86  Temp 98.6 F (37 C) (Oral)  Resp 18  Ht 4\' 11"  (1.499 m)  Wt 83.915 kg (185 lb)  BMI 37.37 kg/m2  LMP 12/09/2010  Breastfeeding? Unknown      FHT:  FHR: 130 bpm, variability: moderate,  accelerations:  Present,  decelerations:  Present Variable UC:   regular, every 5-10 minutes SVE:   Dilation: 3.5 Effacement (%): 30;40 Station: -3 Exam by:: Dr. Thad Ranger Dr. Paulina Fusi  Labs: Lab Results  Component Value Date   WBC 9.0 01/12/2012   HGB 11.8* 01/12/2012   HCT 35.1* 01/12/2012   MCV 91.2 01/12/2012   PLT 186 01/12/2012    Assessment / Plan: Induction of labor due to polyhydramnios,  progressing well on pitocin  Labor: Progressing on Pitocin, will continue to increase then AROM Preeclampsia:  N/A Fetal Wellbeing:  Category II Pain Control:  Epidural I/D:  n/a Anticipated MOD:  NSVD  Gildardo Cranker 01/13/2012, 9:51 AM

## 2012-01-13 NOTE — Progress Notes (Signed)
   Ann Clarke is a 27 y.o. [id number] at [redacted]w[redacted]d  admitted for induction of labor due to Hydramnios.  Subjective: Resting, contractions are mild  Objective: BP 116/57  Pulse 84  Temp 98.3 F (36.8 C) (Oral)  Resp 18  LMP 12/09/2010  Breastfeeding? Unknown    FHT:  FHR: 120. bpm, variability: moderate,  accelerations:  Present,  decelerations:  Absent UC:   irregular, every 1-5 minutes SVE:   deferred  Labs: Lab Results  Component Value Date   WBC 9.0 01/12/2012   HGB 11.8* 01/12/2012   HCT 35.1* 01/12/2012   MCV 91.2 01/12/2012   PLT 186 01/12/2012    Assessment / Plan: ripening phase; IOL for polyhydramnios. TOLAC  Labor: no Fetal Wellbeing:  Category I Pain Control:  Labor support without medications Anticipated MOD:  NSVD  CRESENZO-DISHMAN,Kaja Jackowski 01/13/2012, 3:18 AM

## 2012-01-13 NOTE — Progress Notes (Signed)
Patient ID: Kaitland Lewellyn, female   DOB: 10/11/1985, 27 y.o.   MRN: [id number]  S:  Pt mildly uncomfortable with contractions.  O: Filed Vitals:   01/13/12 1100 01/13/12 1130 01/13/12 1200 01/13/12 1230  BP: 108/63 91/53 103/43 108/61  Pulse: 87 79 72 79  Temp:   99 F (37.2 C)   TempSrc:   Oral   Resp:   18   Height:      Weight:        Cervix:  4-5 externally/long/-5. Could not feel presenting part or bulging bag. Vertex by [employer]  FHTs: 130, mod var, accels present, no decels TOCO:  q 2-5 minutes, pitocin at 7  A/P 27 y.o. [id number] at [redacted]w[redacted]d here for IOL for polyhydramnios - No cervical change - baby feels higher than before - watch for signs of rupture in TOLAC, FHTs reassuring now. Baby is also LGA. - Continue to increase pitocin. - GBS prophylaxis  Napoleon Form, MD 01/13/2012 1:13 PM

## 2012-01-13 NOTE — Anesthesia Postprocedure Evaluation (Signed)
Anesthesia Post Note  Patient: Ann Clarke  Procedure(s) Performed: Procedure(s) (LRB): CESAREAN SECTION (N/A)  Anesthesia type: Epidural  Patient location: PACU  Post pain: Pain level controlled  Post assessment: Post-op Vital signs reviewed  Last Vitals:  Filed Vitals:   01/13/12 2030  BP: 112/57  Pulse: 71  Temp:   Resp: 18    Post vital signs: Reviewed  Level of consciousness: awake  Complications: No apparent anesthesia complications

## 2012-01-13 NOTE — Anesthesia Preprocedure Evaluation (Signed)
Anesthesia Evaluation  Patient identified by MRN, date of birth, ID band Patient awake    Reviewed: Allergy & Precautions, H&P , NPO status , Patient's Chart, lab work & pertinent test results, reviewed documented beta blocker date and time   History of Anesthesia Complications Negative for: history of anesthetic complications  Airway Mallampati: II TM Distance: >3 FB Neck ROM: full    Dental  (+) Teeth Intact   Pulmonary neg pulmonary ROS,  breath sounds clear to auscultation        Cardiovascular negative cardio ROS  Rhythm:regular Rate:Normal     Neuro/Psych negative neurological ROS  negative psych ROS   GI/Hepatic negative GI ROS, Neg liver ROS,   Endo/Other  obese  Renal/GU negative Renal ROS     Musculoskeletal   Abdominal   Peds  Hematology negative hematology ROS (+)   Anesthesia Other Findings NPO for solids since last night, liquids all day today  Reproductive/Obstetrics (+) Pregnancy (h/o c/s x1 under GA in the [place], attempted IOL but now declines VBAC)                           Anesthesia Physical Anesthesia Plan  ASA: II and emergent  Anesthesia Plan: Spinal   Post-op Pain Management:    Induction:   Airway Management Planned:   Additional Equipment:   Intra-op Plan:   Post-operative Plan:   Informed Consent: I have reviewed the patients History and Physical, chart, labs and discussed the procedure including the risks, benefits and alternatives for the proposed anesthesia with the patient or authorized representative who has indicated his/her understanding and acceptance.     Plan Discussed with: CRNA and Surgeon  Anesthesia Plan Comments:         Anesthesia Quick Evaluation

## 2012-01-13 NOTE — Transfer of Care (Signed)
Immediate Anesthesia Transfer of Care Note  Patient: Ann Clarke  Procedure(s) Performed: Procedure(s) (LRB) with comments: CESAREAN SECTION (N/A) - Bilateral Tubal Ligation  Patient Location: PACU  Anesthesia Type:Spinal  Level of Consciousness: awake, alert  and oriented  Airway & Oxygen Therapy: Patient Spontanous Breathing  Post-op Assessment: Report given to PACU RN and Post -op Vital signs reviewed and stable  Post vital signs: Reviewed and stable  Complications: No apparent anesthesia complications

## 2012-01-13 NOTE — Progress Notes (Signed)
   Ann Clarke is a 27 y.o. [id number] at [redacted]w[redacted]d  admitted for induction of labor due to Hydramnios.  Subjective:  resting Objective: BP 127/70  Pulse 91  Temp 98.7 F (37.1 C) (Oral)  Resp 18  LMP 12/09/2010  Breastfeeding? Unknown    FHT:  130, avg LTV; 10X10 accels, no decels UC:   irregular, every 3-6 minutes SVE:    Foley still in despite tugging  Labs: Lab Results  Component Value Date   WBC 9.0 01/12/2012   HGB 11.8* 01/12/2012   HCT 35.1* 01/12/2012   MCV 91.2 01/12/2012   PLT 186 01/12/2012    Assessment / Plan: IOL for polyhydramnios, ripening phase  Labor: no Fetal Wellbeing:  Category I Pain Control:  IV meds prn Anticipated MOD:  NSVD  CRESENZO-DISHMAN,Ann Clarke 01/13/2012, 7:43 AM

## 2012-01-13 NOTE — Progress Notes (Signed)
Carlissa Pesola is a 27 y.o. [id number] at [redacted]w[redacted]d who no longer wants to VBAC.  She is requesting a c/s.    Subjective:   Objective: BP 119/67  Pulse 77  Temp 98.3 F (36.8 C) (Oral)  Resp 18  Ht 4\' 11"  (1.499 m)  Wt 185 lb (83.915 kg)  BMI 37.37 kg/m2  LMP 12/09/2010  Breastfeeding? Unknown     FHT reassuring.  Labs: Lab Results  Component Value Date   WBC 9.0 01/12/2012   HGB 11.8* 01/12/2012   HCT 35.1* 01/12/2012   MCV 91.2 01/12/2012   PLT 186 01/12/2012    Assessment / Plan: Will proceed with c.s and btl at pt's request. The risks of cesarean section discussed with the patient included but were not limited to: bleeding which may require transfusion or reoperation; infection which may require antibiotics; injury to bowel, bladder, ureters or other surrounding organs; injury to the fetus; need for additional procedures including hysterectomy in the event of a life-threatening hemorrhage; placental abnormalities wth subsequent pregnancies, incisional problems, thromboembolic phenomenon and other postoperative/anesthesia complications. The patient concurred with the proposed plan, giving informed written consent for the procedure.   Patient desires permanent sterilization.  Other reversible forms of contraception were discussed with patient; she declines all other modalities. Risks of procedure discussed with patient including but not limited to: risk of regret, permanence of method, bleeding, infection, injury to surrounding organs and need for additional procedures.  Failure risk of 0.5-1% with increased risk of ectopic gestation if pregnancy occurs was also discussed with patient.    Patient verbalized understanding of these risks and wants to proceed with sterilization.  Written informed consent obtained.  To OR when ready.     Kitana Gage H. 01/13/2012, 6:26 PM

## 2012-01-14 ENCOUNTER — Encounter (HOSPITAL_COMMUNITY): Payer: Self-pay | Admitting: Obstetrics & Gynecology

## 2012-01-14 LAB — CBC
MCH: 31.5 pg (ref 26.0–34.0)
MCV: 91.6 fL (ref 78.0–100.0)
Platelets: 145 10*3/uL — ABNORMAL LOW (ref 150–400)
RBC: 3.08 MIL/uL — ABNORMAL LOW (ref 3.87–5.11)
RDW: 13.9 % (ref 11.5–15.5)
WBC: 7.9 10*3/uL (ref 4.0–10.5)

## 2012-01-14 NOTE — Progress Notes (Signed)
Subjective: Postpartum Day 1: Cesarean Delivery Patient reports incisional pain, tolerating PO and + flatus. Foley still in. Ambulating. Moderate bloody drainage on dressing.   Objective: Vital signs in last 24 hours: Temp:  [97.7 F (36.5 C)-99 F (37.2 C)] 98.6 F (37 C) (01/10 0945) Pulse Rate:  [68-89] 80  (01/10 0945) Resp:  [16-18] 18  (01/10 0945) BP: (90-128)/(43-80) 111/68 mmHg (01/10 0945) SpO2:  [97 %-99 %] 97 % (01/10 0945)  Physical Exam:  General: alert, cooperative and no distress Lochia: appropriate Uterine Fundus: firm Incision: healing well, no significant drainage, no dehiscence, no significant erythema; Dressing with moderate amount of bloody drainage. Removed dressing, no drainage with pressure. Replaced honeycomb dressing. DVT Evaluation: No evidence of DVT seen on physical exam. Negative Homan's sign. No cords or calf tenderness. No significant calf/ankle edema.   Basename 01/14/12 0515 01/12/12 2000  HGB 9.5* 11.8*  HCT 28.2* 35.1*    Assessment/Plan: Status post Cesarean section. Doing well postoperatively.  Will recheck dressing later today and apply pressure dressing if needed. Continue current care.  Napoleon Form 01/14/2012, 11:34 AM

## 2012-01-14 NOTE — Anesthesia Postprocedure Evaluation (Signed)
  Anesthesia Post-op Note  Patient: Ann Clarke  Procedure(s) Performed: Procedure(s) (LRB) with comments: CESAREAN SECTION (N/A) - Bilateral Tubal Ligation  Patient Location: Mother/Baby  Anesthesia Type:Spinal  Level of Consciousness: awake, alert  and oriented  Airway and Oxygen Therapy: Patient Spontanous Breathing  Post-op Pain: mild  Post-op Assessment: Patient's Cardiovascular Status Stable, Respiratory Function Stable, No headache, No backache, No residual numbness and No residual motor weakness  Post-op Vital Signs: stable  Complications: No apparent anesthesia complications

## 2012-01-14 NOTE — Addendum Note (Signed)
Addendum  created 01/14/12 0816 by Earmon Phoenix, CRNA   Modules edited:Notes Section

## 2012-01-15 MED ORDER — IBUPROFEN 600 MG PO TABS: 600.0000 mg | ORAL_TABLET | Freq: Four times a day (QID) | ORAL | Status: AC

## 2012-01-15 MED ORDER — OXYCODONE HCL 5 MG PO TABS: 5.0000 mg | ORAL_TABLET | ORAL | Status: AC | PRN

## 2012-01-15 NOTE — Discharge Summary (Signed)
Patient initially wanted to go home but changed her mind.  Discharged postponed until 01/16/12.  Progress note written for 01/15/12.   Obstetric Discharge Summary Reason for Admission: induction of labor for polyhydramnios; had 1 prev C/S for breech and one VBAC with desire for VBAC this pregnancy; during the induction process pt elected for C/S  Prenatal Procedures: none Intrapartum Procedures: cesarean: low cervical, transverse and tubal ligation Postpartum Procedures: none Complications-Operative and Postpartum: none Hemoglobin  Date Value Range Status  01/14/2012 9.5* 12.0 - 15.0 g/dL Final     DELTA CHECK NOTED     REPEATED TO VERIFY     HCT  Date Value Range Status  01/14/2012 28.2* 36.0 - 46.0 % Final    Physical Exam:  General: alert, cooperative and no distress Lochia: appropriate Uterine Fundus: firm Incision: healing well, no significant drainage, no significant erythema DVT Evaluation: No evidence of DVT seen on physical exam.  Discharge Diagnoses: Term Pregnancy-delivered and Bilateral tubal ligation  Discharge Information: Date: 01/15/2012 Activity: pelvic rest Diet: routine Medications: PNV, Ibuprofen and Roxicodone Condition: stable Instructions: refer to practice specific booklet Discharge to: home Follow-up Information    Follow up with [facility] [place]. (Make a postpartum appointment for 4-6 weeks)    Contact information:   [address] [place] [address] [phone]         Newborn Data: Live born female  Birth Weight: 7 lb 12 oz (3515 g) APGAR: 8, 9  Home with mother. Breast and bottlefeeding.  Cam Hai 01/15/2012, 7:36 AM

## 2012-01-16 LAB — TYPE AND SCREEN
Antibody Screen: NEGATIVE
Unit division: 0

## 2012-01-16 NOTE — Discharge Summary (Signed)
Obstetric Discharge Summary Reason for Admission: induction of labor for polyhydramnios; had 1 prev C/S for breech and one VBAC with desire for VBAC this pregnancy; during the induction process pt elected for C/S  Prenatal Procedures: none Intrapartum Procedures: cesarean: low cervical, transverse and tubal ligation Postpartum Procedures: none Complications-Operative and Postpartum: none Hemoglobin  Date Value Range Status  01/14/2012 9.5* 12.0 - 15.0 g/dL Final     DELTA CHECK NOTED     REPEATED TO VERIFY     HCT  Date Value Range Status  01/14/2012 28.2* 36.0 - 46.0 % Final    Physical Exam:  BP 101/64  Pulse 73  Temp 98.2 F (36.8 C) (Oral)  Resp 18  Ht 4\' 11"  (1.499 m)  Wt 185 lb (83.915 kg)  BMI 37.37 kg/m2  SpO2 97%  LMP 12/09/2010  Breastfeeding? Unknown General: alert, cooperative and no distress Lochia: appropriate Uterine Fundus: firm Incision: healing well, no significant drainage, no significant erythema DVT Evaluation: No evidence of DVT seen on physical exam.  Discharge Diagnoses: Term Pregnancy-delivered and Bilateral tubal ligation  Discharge Information: Date: 01/16/2012 Activity: pelvic rest Diet: routine Medications: PNV, Ibuprofen and Roxicodone Condition: stable Instructions: refer to practice specific booklet Discharge to: home Follow-up Information    Follow up with [facility] [place]. (Make a postpartum appointment for 4-6 weeks)    Contact information:   [address] [place] [address] [phone]         Newborn Data: Live born female  Birth Weight: 7 lb 12 oz (3515 g) APGAR: 8, 9  Home with mother. Breast and bottlefeeding.  Aylanie Cubillos A 01/16/2012, 7:42 AM

## 2012-01-16 NOTE — Progress Notes (Signed)
Subjective: Postpartum Day 2: Cesarean Delivery Patient reports tolerating PO, + flatus and no problems voiding.    Objective: Vital signs in last 24 hours: Temp:  [98.2 F (36.8 C)-98.6 F (37 C)] 98.2 F (36.8 C) (01/12 0614) Pulse Rate:  [73-89] 73  (01/12 0614) Resp:  [18] 18  (01/12 0614) BP: (101-103)/(55-64) 101/64 mmHg (01/12 5784)  Physical Exam:  General: alert Lochia: appropriate Uterine Fundus: firm Incision: healing well, no significant drainage DVT Evaluation: No evidence of DVT seen on physical exam. Negative Homan's sign.   Basename 01/14/12 0515  HGB 9.5*  HCT 28.2*    Assessment/Plan: Status post Cesarean section. Doing well postoperatively.  Discharge tomorrow.  ANYANWU,UGONNA A 01/15/2012, 10:49 AM

## 2012-01-17 NOTE — H&P (Signed)
Attestation of Attending Supervision of Advanced Practitioner: Evaluation and management procedures were performed by the PA/NP/CNM/OB Fellow under my supervision/collaboration. Chart reviewed and agree with management and plan.  Tod Abrahamsen V 01/17/2012 9:20 AM

## 2012-01-22 NOTE — Op Note (Signed)
Agree with above note.  Ann Clarke H. 01/22/2012 11:52 AM

## 2012-01-28 ENCOUNTER — Telehealth: Payer: Self-pay | Admitting: *Deleted

## 2012-01-28 NOTE — Telephone Encounter (Signed)
Patients husband left a message requesting a call back.

## 2012-02-03 NOTE — Telephone Encounter (Signed)
Called patient with Ann Clarke [phone] and was unable to leave message at either patient phone number and significant other phone number.

## 2013-11-05 ENCOUNTER — Encounter (HOSPITAL_COMMUNITY): Payer: Self-pay | Admitting: Obstetrics & Gynecology

## 2014-01-27 IMAGING — US US OB DETAIL+14 WK
1 series · 12 of 28 positions shown · non-contrast
Comparison: none

[Series 1: us ob detail +14 wk · 69 acquisitions, 12 frames shown]
[im 3/69]
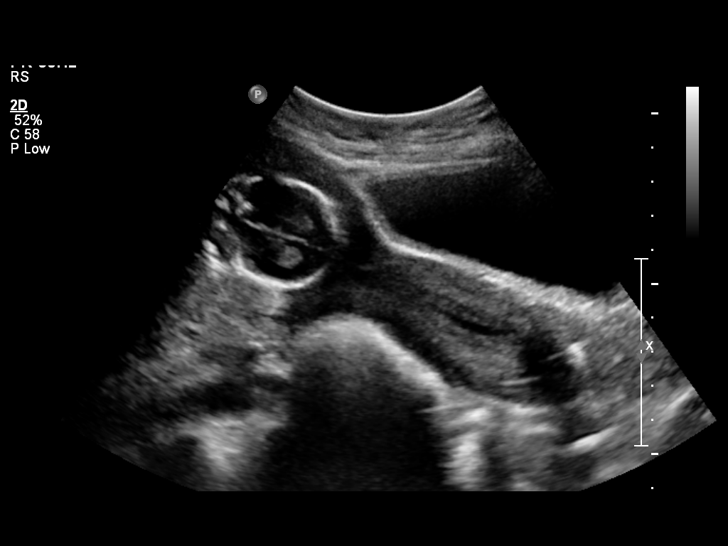
[im 8/69]
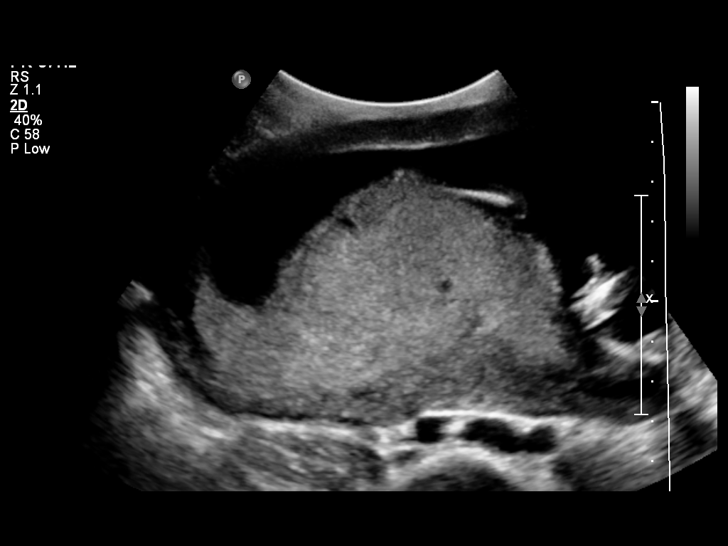
[im 13/69]
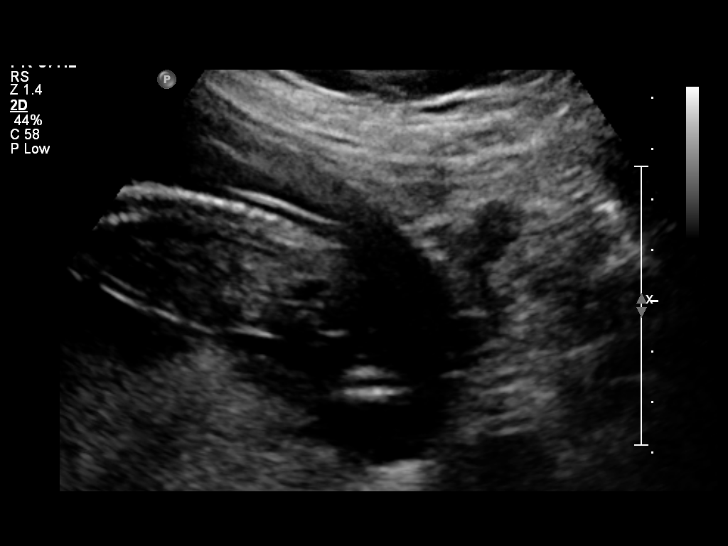
[im 21/69]
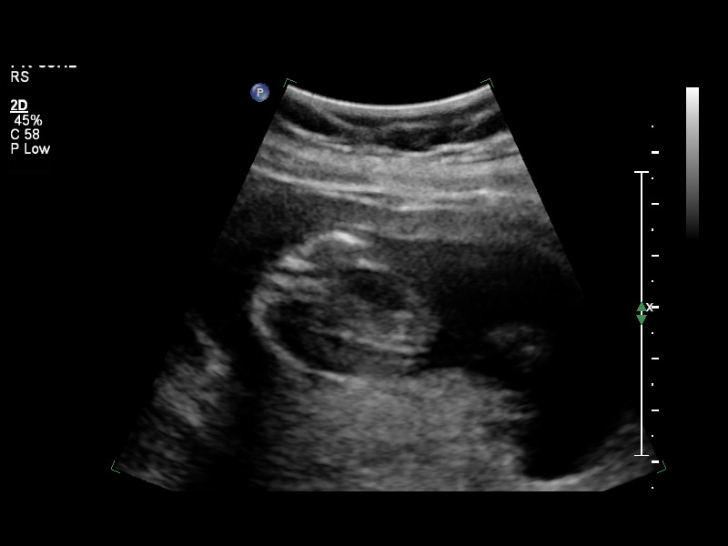
[im 26/69]
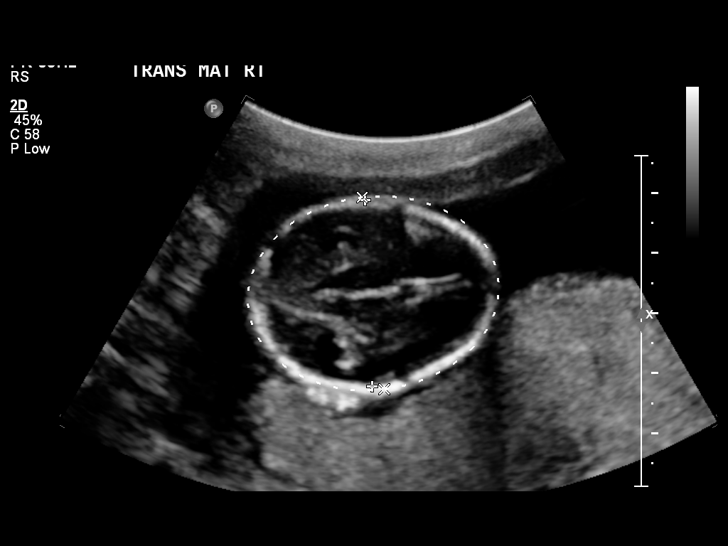
[im 31/69]
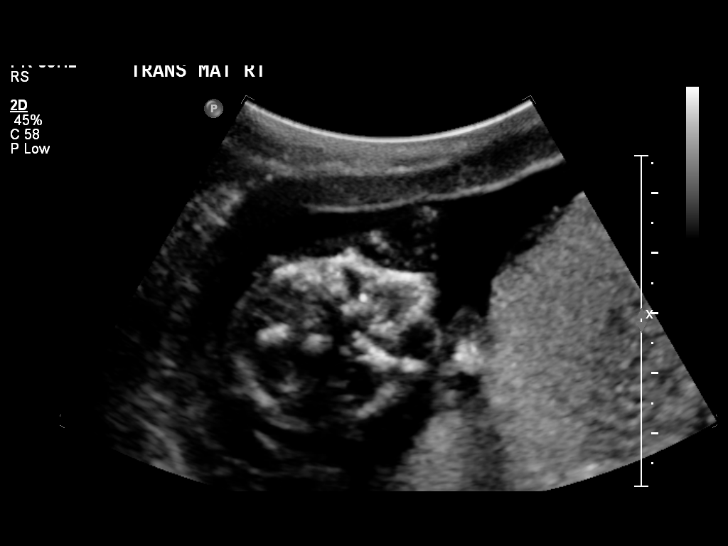
[im 38/69]
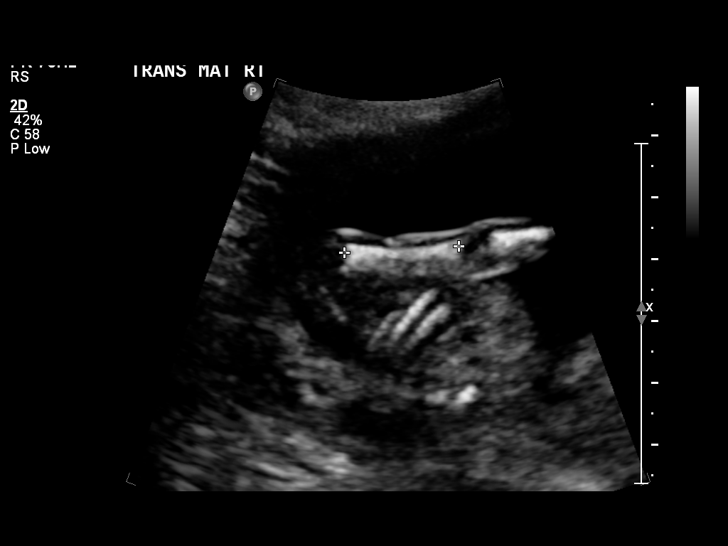
[im 43/69]
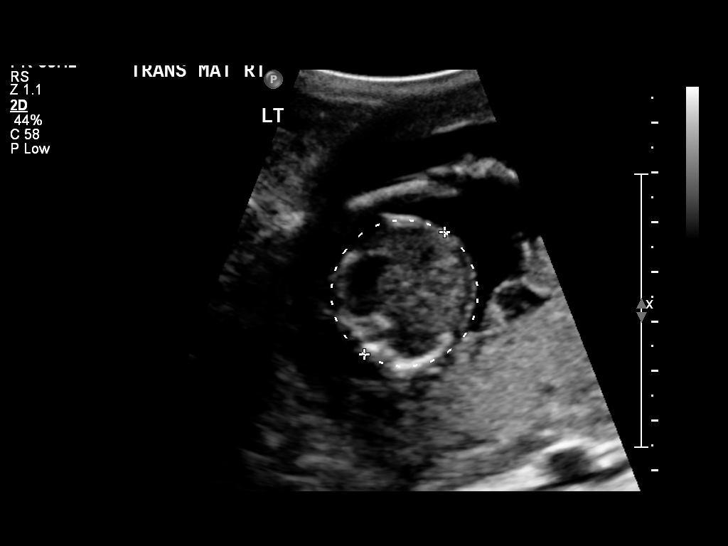
[im 48/69]
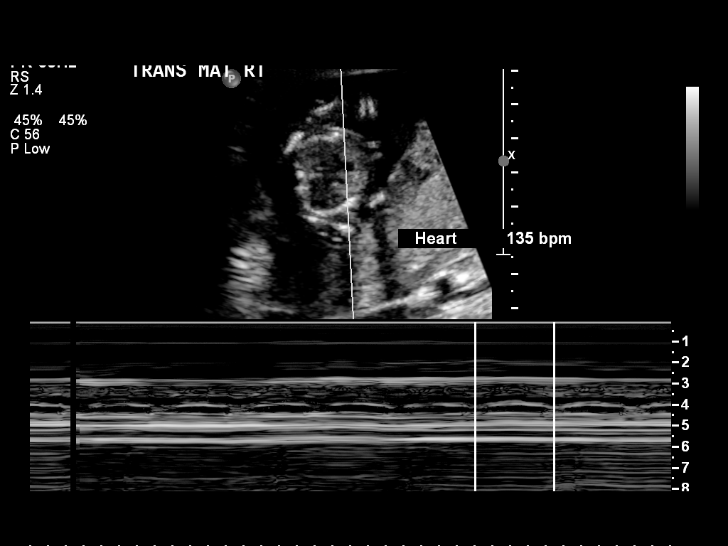
[im 56/69]
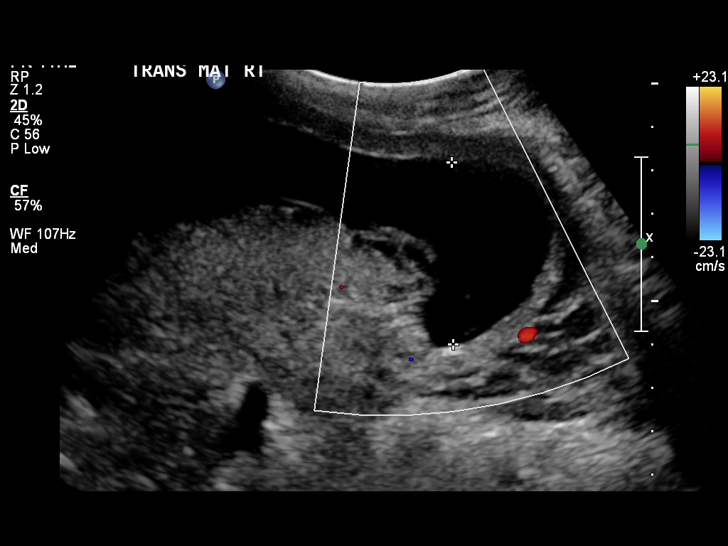
[im 61/69]
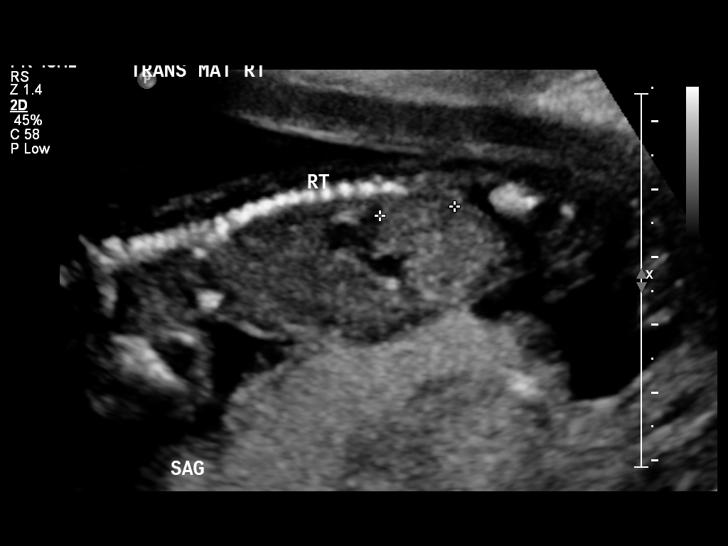
[im 66/69]
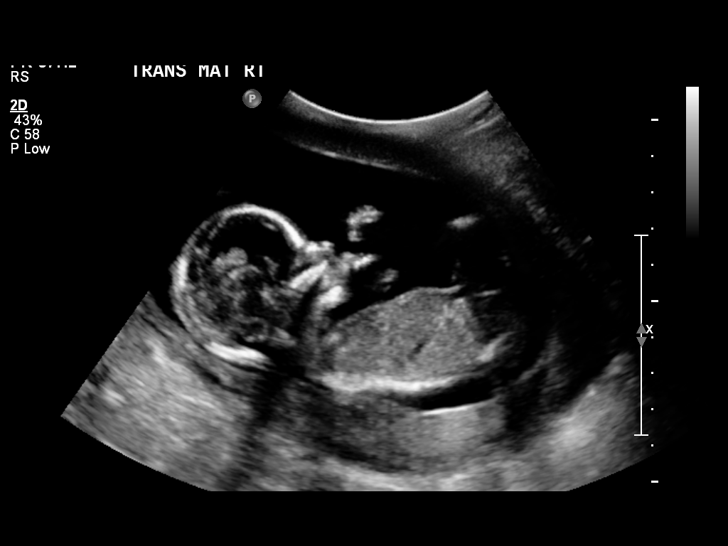

[12 of 28 positions shown; findings below may reference images not displayed]

OBSTETRICS REPORT
                      (Signed Final 08/02/2011 [DATE])

 Order#:         [id number]
Procedures

 US OB DETAIL + 14 WK                                  76811.0
Indications

 Uncertain LMP;  Establish Gestational [AGE]
 Detailed fetal anatomic survey
Fetal Evaluation

 Preg. Location:    Intrauterine
 Fetal Heart Rate:  135                         bpm
 Cardiac Activity:  Observed
 Presentation:      Transverse, head to
                    maternal left
 Placenta:          Posterior, edge at internal
                    cervical os

 Amniotic Fluid
 AFI FV:      Subjectively within normal limits
                                             Larg Pckt:   4.16   cm
Biometry

 BPD:     30.9  mm    G. Age:   15w 5d                CI:        69.82   70 - 86
                                                      FL/HC:      16.3   13.3 -

 HC:       118  mm    G. Age:   15w 6d       41  %    HC/AC:      1.23   1.05 -

 AC:      96.2  mm    G. Age:   15w 5d       52  %    FL/BPD:
 FL:      19.2  mm    G. Age:   15w 5d       45  %    FL/AC:      20.0   20 - 24
 HUM:     19.1  mm    G. Age:   15w 4d       50  %

 Est. FW:     133  gm      0 lb 5 oz     71  %
Gestational Age

 U/S Today:     15w 5d                                        EDD:   01/19/12
 Best:          15w 5d    Det. By:   U/S (08/02/11)           EDD:   01/19/12
Anatomy

 Cranium:           Appears normal      Aortic Arch:       Not well
                                                           visualized
 Fetal Cavum:       Appears normal      Ductal Arch:       Not well
                                                           visualized
 Ventricles:        Appears normal      Diaphragm:         Not well
                                                           visualized
 Choroid Plexus:    Appears normal      Stomach:           Appears
                                                           normal, left
                                                           sided
 Cerebellum:        Appears normal      Abdomen:           Appears normal
 Posterior Fossa:   Appears normal      Abdominal Wall:    Appears nml
                                                           (cord insert,
                                                           abd wall)
 Nuchal Fold:       Appears normal      Cord Vessels:      Appears normal
                    (neck, nuchal                          (3 vessel cord)
                    fold)
 Face:              Not well            Kidneys:           Appear normal
                    visualized
 Heart:             Appears normal      Bladder:           Appears normal
                    (4 chamber &
                    axis)
 RVOT:              Not well            Spine:             Appears normal
                    visualized
 LVOT:              Not well            Limbs:             Four extremities
                    visualized                             seen

 Other:     Fetus appears to be a male. Technically difficult due to
            early GA. 5th digit visualized.
Cervix Uterus Adnexa

 Cervical Length:   4.5       cm

 Cervix:       Closed.
 Uterus:       No abnormality visualized.

 Left Ovary:   No adnexal mass visualized.
 Right Ovary:  No adnexal mass visualized.
 Adnexa:     No abnormality visualized.
Impression

 Single live IUP in transverse, head maternal left,
 presentation.   Dating by today's exam 24w4d suggesting
 inaccurate LMP.
 No early anatomic abnormality identified. Recommend repeat
 exam at 18-20 weeks GA for better anatomic visualization
 due to early GA today.
 Posterior placenta, edge at internal cervical os. This can also
 be re-assessed at 18-20 weeks GA.

 questions or concerns.

## 2017-12-03 ENCOUNTER — Emergency Department (HOSPITAL_COMMUNITY)
Admission: EM | Admit: 2017-12-03 | Discharge: 2017-12-03 | Disposition: A | Discharge status: Home / Self Care | Payer: No Typology Code available for payment source | Attending: Emergency Medicine | Admitting: Emergency Medicine

## 2017-12-03 ENCOUNTER — Encounter (HOSPITAL_COMMUNITY): Payer: Self-pay | Admitting: Emergency Medicine

## 2017-12-03 ENCOUNTER — Emergency Department (HOSPITAL_COMMUNITY): Payer: No Typology Code available for payment source

## 2017-12-03 DIAGNOSIS — M25531 Pain in right wrist: Secondary | ICD-10-CM | POA: Insufficient documentation

## 2017-12-03 DIAGNOSIS — M542 Cervicalgia: Secondary | ICD-10-CM | POA: Insufficient documentation

## 2017-12-03 DIAGNOSIS — M25532 Pain in left wrist: Secondary | ICD-10-CM | POA: Diagnosis not present

## 2017-12-03 DIAGNOSIS — M545 Low back pain: Secondary | ICD-10-CM | POA: Diagnosis not present

## 2017-12-03 DIAGNOSIS — Y9389 Activity, other specified: Secondary | ICD-10-CM | POA: Diagnosis not present

## 2017-12-03 DIAGNOSIS — M79642 Pain in left hand: Secondary | ICD-10-CM | POA: Insufficient documentation

## 2017-12-03 DIAGNOSIS — Z79899 Other long term (current) drug therapy: Secondary | ICD-10-CM | POA: Diagnosis not present

## 2017-12-03 DIAGNOSIS — M79641 Pain in right hand: Secondary | ICD-10-CM | POA: Diagnosis not present

## 2017-12-03 DIAGNOSIS — S20319A Abrasion of unspecified front wall of thorax, initial encounter: Secondary | ICD-10-CM | POA: Diagnosis not present

## 2017-12-03 DIAGNOSIS — Y999 Unspecified external cause status: Secondary | ICD-10-CM | POA: Diagnosis not present

## 2017-12-03 DIAGNOSIS — Y92410 Unspecified street and highway as the place of occurrence of the external cause: Secondary | ICD-10-CM | POA: Insufficient documentation

## 2017-12-03 DIAGNOSIS — S299XXA Unspecified injury of thorax, initial encounter: Secondary | ICD-10-CM | POA: Diagnosis present

## 2017-12-03 DIAGNOSIS — M546 Pain in thoracic spine: Secondary | ICD-10-CM | POA: Diagnosis not present

## 2017-12-03 LAB — POC URINE PREG, ED: Preg Test, Ur: NEGATIVE

## 2017-12-03 MED ORDER — NAPROXEN 500 MG PO TABS: 500.0000 mg | ORAL_TABLET | Freq: Two times a day (BID) | ORAL | 0 refills | Status: AC

## 2017-12-03 MED ORDER — METHOCARBAMOL 500 MG PO TABS: 500.0000 mg | ORAL_TABLET | Freq: Three times a day (TID) | ORAL | 0 refills | Status: AC | PRN
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
## 2017-12-03 NOTE — ED Notes (Signed)
Pt back from imaging
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
## 2022-08-22 ENCOUNTER — Emergency Department (HOSPITAL_COMMUNITY)
Admission: EM | Admit: 2022-08-22 | Discharge: 2022-08-23 | Discharge status: Against Medical Advice | Payer: Self-pay | Attending: Emergency Medicine | Admitting: Emergency Medicine

## 2022-08-22 ENCOUNTER — Other Ambulatory Visit: Payer: Self-pay

## 2022-08-22 ENCOUNTER — Emergency Department (HOSPITAL_COMMUNITY): Payer: Self-pay

## 2022-08-22 ENCOUNTER — Encounter (HOSPITAL_COMMUNITY): Payer: Self-pay

## 2022-08-22 DIAGNOSIS — R251 Tremor, unspecified: Secondary | ICD-10-CM | POA: Insufficient documentation

## 2022-08-22 DIAGNOSIS — Z5321 Procedure and treatment not carried out due to patient leaving prior to being seen by health care provider: Secondary | ICD-10-CM | POA: Insufficient documentation

## 2022-08-22 LAB — BASIC METABOLIC PANEL
Anion gap: 13 (ref 5–15)
BUN: 13 mg/dL (ref 6–20)
CO2: 24 mmol/L (ref 22–32)
Calcium: 9.3 mg/dL (ref 8.9–10.3)
Chloride: 101 mmol/L (ref 98–111)
Creatinine, Ser: 0.8 mg/dL (ref 0.44–1.00)
GFR, Estimated: >60 mL/min (ref >60)
Glucose, Bld: 114 mg/dL — ABNORMAL HIGH (ref 70–99)
Potassium: 3.7 mmol/L (ref 3.5–5.1)
Sodium: 138 mmol/L (ref 135–145)

## 2022-08-22 LAB — CBC
HCT: 37 % (ref 36.0–46.0)
Hemoglobin: 12.1 g/dL (ref 12.0–15.0)
MCH: 29.7 pg (ref 26.0–34.0)
MCHC: 32.7 g/dL (ref 30.0–36.0)
MCV: 90.9 fL (ref 80.0–100.0)
Platelets: 247 10*3/uL (ref 150–400)
RBC: 4.07 MIL/uL (ref 3.87–5.11)
RDW: 12.4 % (ref 11.5–15.5)
WBC: 6.7 10*3/uL (ref 4.0–10.5)
nRBC: 0 % (ref 0.0–0.2)

## 2022-08-22 LAB — HCG, SERUM, QUALITATIVE: Preg, Serum: NEGATIVE

## 2022-08-22 NOTE — ED Notes (Signed)
Pt seen leaving ED 

## 2022-08-22 NOTE — ED Triage Notes (Signed)
Pt came in via POV d/t feeling jittery in her hands intermittently the last few days & said her heart rate feels high. A/Ox4, denies pain.

## 2022-09-05 ENCOUNTER — Encounter (HOSPITAL_COMMUNITY): Payer: Self-pay

## 2022-09-05 ENCOUNTER — Emergency Department (HOSPITAL_COMMUNITY): Payer: Self-pay

## 2022-09-05 ENCOUNTER — Other Ambulatory Visit: Payer: Self-pay

## 2022-09-05 ENCOUNTER — Emergency Department (HOSPITAL_COMMUNITY)
Admission: EM | Admit: 2022-09-05 | Discharge: 2022-09-05 | Disposition: A | Discharge status: Home / Self Care | Payer: Self-pay | Attending: Emergency Medicine | Admitting: Emergency Medicine

## 2022-09-05 DIAGNOSIS — R110 Nausea: Secondary | ICD-10-CM | POA: Insufficient documentation

## 2022-09-05 DIAGNOSIS — M542 Cervicalgia: Secondary | ICD-10-CM | POA: Insufficient documentation

## 2022-09-05 DIAGNOSIS — H538 Other visual disturbances: Secondary | ICD-10-CM | POA: Insufficient documentation

## 2022-09-05 DIAGNOSIS — R519 Headache, unspecified: Secondary | ICD-10-CM | POA: Insufficient documentation

## 2022-09-05 LAB — COMPREHENSIVE METABOLIC PANEL
ALT: 15 U/L (ref 0–44)
AST: 19 U/L (ref 15–41)
Albumin: 3.8 g/dL (ref 3.5–5.0)
Alkaline Phosphatase: 36 U/L — ABNORMAL LOW (ref 38–126)
Anion gap: 9 (ref 5–15)
BUN: 11 mg/dL (ref 6–20)
CO2: 25 mmol/L (ref 22–32)
Calcium: 9.2 mg/dL (ref 8.9–10.3)
Chloride: 104 mmol/L (ref 98–111)
Creatinine, Ser: 0.75 mg/dL (ref 0.44–1.00)
GFR, Estimated: >60 mL/min (ref >60)
Glucose, Bld: 79 mg/dL (ref 70–99)
Potassium: 3.7 mmol/L (ref 3.5–5.1)
Sodium: 138 mmol/L (ref 135–145)
Total Bilirubin: 0.6 mg/dL (ref 0.3–1.2)
Total Protein: 8 g/dL (ref 6.5–8.1)

## 2022-09-05 LAB — CBC WITH DIFFERENTIAL/PLATELET
Abs Immature Granulocytes: 0 10*3/uL (ref 0.00–0.07)
Basophils Absolute: 0 10*3/uL (ref 0.0–0.1)
Basophils Relative: 1 %
Eosinophils Absolute: 0 10*3/uL (ref 0.0–0.5)
Eosinophils Relative: 1 %
HCT: 37.7 % (ref 36.0–46.0)
Hemoglobin: 12.2 g/dL (ref 12.0–15.0)
Immature Granulocytes: 0 %
Lymphocytes Relative: 36 %
Lymphs Abs: 2 10*3/uL (ref 0.7–4.0)
MCH: 29.3 pg (ref 26.0–34.0)
MCHC: 32.4 g/dL (ref 30.0–36.0)
MCV: 90.4 fL (ref 80.0–100.0)
Monocytes Absolute: 0.5 10*3/uL (ref 0.1–1.0)
Monocytes Relative: 8 %
Neutro Abs: 3.1 10*3/uL (ref 1.7–7.7)
Neutrophils Relative %: 54 %
Platelets: 264 10*3/uL (ref 150–400)
RBC: 4.17 MIL/uL (ref 3.87–5.11)
RDW: 12.1 % (ref 11.5–15.5)
WBC: 5.6 10*3/uL (ref 4.0–10.5)
nRBC: 0 % (ref 0.0–0.2)

## 2022-09-05 MED ORDER — KETOROLAC TROMETHAMINE 15 MG/ML IJ SOLN
INTRAMUSCULAR | Status: AC
  Administered 2022-09-05: 15 mg via INTRAVENOUS
  Filled 2022-09-05: qty 1

## 2022-09-05 MED ORDER — IOHEXOL 350 MG/ML SOLN
75.0000 mL | Freq: Once | INTRAVENOUS | Status: AC | PRN
  Administered 2022-09-05: 75 mL via INTRAVENOUS

## 2022-09-05 MED ORDER — KETOROLAC TROMETHAMINE 60 MG/2ML IM SOLN: 15.0000 mg | Freq: Once | INTRAMUSCULAR | Status: DC

## 2022-09-05 NOTE — Care Management (Signed)
Patient without PCP, placed one on AVS instructions in Swahili

## 2022-09-05 NOTE — ED Provider Notes (Cosign Needed)
[place] EMERGENCY DEPARTMENT AT [facility] Provider Note   CSN: [id number] Arrival date & time: 09/05/22  1026     History  Chief Complaint  Patient presents with   Headache   Neck Pain    Ann Clarke is a 37 y.o. female history of tension type headache presents today for evaluation of headache and neck pain.  She states that her symptoms have been going on for 2 weeks now.  States that she also has vision change to her left eye.  Symptoms are intermittent.  No history of migraines.  Endorses nausea without vomiting.  Denies any fever, cough, runny nose, chest pain, shortness of breath, dizziness, abdominal pain, bowel change, urinary symptoms.  Denies any recent fall, head trauma or neck trauma.   Headache Associated symptoms: neck pain   Neck Pain Associated symptoms: headaches     Past Medical History:  Diagnosis Date   Headache(784.0)    one or two a year; Ibuprofen helps   No pertinent past medical history    Past Surgical History:  Procedure Laterality Date   CESAREAN SECTION  2005   CESAREAN SECTION  01/13/2012   Procedure: CESAREAN SECTION;  Surgeon: Lesly Dukes, MD;  Location: [facility];  Service: Obstetrics;  Laterality: N/A;  Bilateral Tubal Ligation     Home Medications Prior to Admission medications   Medication Sig Start Date End Date Taking? Authorizing Provider  ibuprofen (ADVIL,MOTRIN) 600 MG tablet Take 1 tablet (600 mg total) by mouth every 6 (six) hours. 01/15/12   Arabella Merles, CNM  methocarbamol (ROBAXIN) 500 MG tablet Take 1 tablet (500 mg total) by mouth every 8 (eight) hours as needed. 12/03/17   Petrucelli, Samantha R, PA-C  naproxen (NAPROSYN) 500 MG tablet Take 1 tablet (500 mg total) by mouth 2 (two) times daily. 12/03/17   Petrucelli, Samantha R, PA-C  oxyCODONE (OXY IR/ROXICODONE) 5 MG immediate release tablet Take 1-2 tablets (5-10 mg total) by mouth every 4 (four) hours as needed. 01/15/12   Arabella Merles, CNM  Prenatal  Vit-Fe Fumarate-FA (PRENATAL MULTIVITAMIN) TABS Take 1 tablet by mouth daily.    [provider]      Allergies    Tylenol [acetaminophen]    Review of Systems   Review of Systems  Musculoskeletal:  Positive for neck pain.  Neurological:  Positive for headaches.    Physical Exam Updated Vital Signs BP 108/75 (BP Location: Right Arm)   Pulse 62   Temp 98.5 F (36.9 C) (Oral)   Resp 16   Ht 5' (1.524 m)   Wt 82.1 kg   LMP 08/20/2022 (Exact Date)   SpO2 100%   BMI 35.35 kg/m  Physical Exam Vitals and nursing note reviewed.  Constitutional:      Appearance: Normal appearance.  HENT:     Head: Normocephalic and atraumatic.     Mouth/Throat:     Mouth: Mucous membranes are moist.  Eyes:     General: No scleral icterus. Cardiovascular:     Rate and Rhythm: Normal rate and regular rhythm.     Pulses: Normal pulses.     Heart sounds: Normal heart sounds.  Pulmonary:     Effort: Pulmonary effort is normal.     Breath sounds: Normal breath sounds.  Abdominal:     General: Abdomen is flat.     Palpations: Abdomen is soft.     Tenderness: There is no abdominal tenderness.  Musculoskeletal:  General: No deformity.  Skin:    General: Skin is warm.     Findings: No rash.  Neurological:     General: No focal deficit present.     Mental Status: She is alert.     Comments: Cranial nerves II through XII intact. Intact sensation to light touch in all 4 extremities. 5/5 strength in all 4 extremities. Intact finger-to-nose and heel-to-shin of all 4 extremities. No visual field cuts. No neglect noted. No aphasia noted.   Psychiatric:        Mood and Affect: Mood normal.     ED Results / Procedures / Treatments   Labs (all labs ordered are listed, but only abnormal results are displayed) Labs Reviewed  COMPREHENSIVE METABOLIC PANEL - Abnormal; Notable for the following components:      Result Value   Alkaline Phosphatase 36 (*)    All other components within  normal limits  CBC WITH DIFFERENTIAL/PLATELET    EKG None  Radiology No results found.  Procedures Procedures    Medications Ordered in ED Medications  ketorolac (TORADOL) injection 15 mg (has no administration in time range)    ED Course/ Medical Decision Making/ A&P Clinical Course as of 09/05/22 1542  Sun Sep 05, 2022  1454 Headache, neck pain x 2 weeks. Left eye blurry vision. - Labs - CTA head and neck [KH]    Clinical Course User Index [KH] [name], DO                                 Medical Decision Making Amount and/or Complexity of Data Reviewed Labs: ordered. Radiology: ordered.  Risk Prescription drug management.   This patient presents to the ED for headache, vision change, this involves an extensive number of treatment options, and is a complaint that carries with a high risk of complications and morbidity.  The differential diagnosis includes cluster headache, migraine, sinusitis, tension headache, acute glaucoma, cervical artery dissection, CO poisoning, encephalitis, encephalopathy, meningitis, mass, pseudotumor, subarachnoid hemorrhage, temporal arteritis/giant cell arteritis, traumatic intracranial hemorrhage.  This is not an exhaustive list.  Lab tests: I ordered and personally interpreted labs.  The pertinent results include: WBC unremarkable. Hbg unremarkable. Platelets unremarkable. Electrolytes unremarkable. BUN, creatinine unremarkable.   Imaging studies: I ordered imaging studies, personally reviewed, interpreted imaging and agree with the radiologist's interpretations. The results include: CT angio head and neck ordered and pending.  Problem list/ ED course/ Critical interventions/ Medical management: HPI: See above Vital signs within normal range and stable throughout visit. Laboratory/imaging studies significant for: See above. On physical examination, patient is afebrile and appears in no acute distress.  Neuroexam with no  focal neurological deficit.  No recent head trauma.  Low suspicion for head bleed.  Given Toradol for pain.  CT angio head and neck order and pending. I have reviewed the patient home medicines and have made adjustments as needed.  Cardiac monitoring/EKG: The patient was maintained on a cardiac monitor.  I personally reviewed and interpreted the cardiac monitor which showed an underlying rhythm of: sinus rhythm.  Additional history obtained: External records from outside source obtained and reviewed including: Chart review including previous notes, labs, imaging.  Consultations obtained:  Disposition Patient's care signed out at shift change to Dr. Valora Corporal with pending CT angio head and neck.  This chart was dictated using voice recognition software.  Despite best efforts to proofread,  errors can occur which can change  the documentation meaning.          Final Clinical Impression(s) / ED Diagnoses Final diagnoses:  None    Rx / DC Orders ED Discharge Orders     None         Jeanelle Malling, [place] 09/05/22 1548

## 2022-09-05 NOTE — ED Triage Notes (Signed)
Pt arrived POV from home c/o a headache and neck pain. Pt states this has been on going for 2 weeks. Pt states sometimes it effects her vision.

## 2022-09-07 NOTE — ED Provider Notes (Signed)
  Physical Exam  BP 117/72 (BP Location: Right Arm)   Pulse (!) 55   Temp 98.7 F (37.1 C) (Oral)   Resp 16   Ht 5' (1.524 m)   Wt 82.1 kg   LMP 08/20/2022 (Exact Date)   SpO2 100%   BMI 35.35 kg/m   Physical Exam  Procedures  Procedures  ED Course / MDM   Clinical Course as of 09/07/22 0018  Sun Sep 05, 2022  1454 Headache, neck pain x 2 weeks. Left eye blurry vision. - Labs - CTA head and neck [KH]    Clinical Course User Index [KH] Ann Cooper, DO   Medical Decision Making Amount and/or Complexity of Data Reviewed Labs: ordered. Radiology: ordered.  Risk Prescription drug management.   At the time my assumption of care, patient is afebrile, hemodynamically stable, in no acute distress.  On my evaluation she does report significant improvement in her symptoms and states that she is no longer having either headache or neck pain.  She additionally reports that her vision is intact.  Results reviewed.  CTA of the head and neck is without acute abnormality.  Given this and her improvement in symptoms, feel that she is appropriate for discharge with outpatient follow-up.  Recommended that she make an appointment with her primary care doctor.  Additionally recommended Excedrin for potential ongoing migraine.  Patient reports understanding agreement with the plan.  Return precautions given.  Discharged without further acute eval under my care in the emergency department.       Ann Cooper, DO 09/07/22 0019    Ann Sprout, MD 09/07/22 2149
